# Patient Record
Sex: Female | Born: 1990 | Race: Black or African American | Hispanic: No | Marital: Single | Smoking: Former smoker
Health system: Southern US, Community
[De-identification: ages and names within clinical notes are randomized; demographics above are authoritative.]

## PROBLEM LIST (undated history)

## (undated) ENCOUNTER — Inpatient Hospital Stay (HOSPITAL_COMMUNITY): Payer: Self-pay

## (undated) DIAGNOSIS — R87619 Unspecified abnormal cytological findings in specimens from cervix uteri: Secondary | ICD-10-CM

## (undated) DIAGNOSIS — F209 Schizophrenia, unspecified: Secondary | ICD-10-CM

## (undated) DIAGNOSIS — O2620 Pregnancy care for patient with recurrent pregnancy loss, unspecified trimester: Secondary | ICD-10-CM

## (undated) HISTORY — PX: NO PAST SURGERIES: SHX2092

## (undated) HISTORY — DX: Pregnancy care for patient with recurrent pregnancy loss, unspecified trimester: O26.20

## (undated) HISTORY — DX: Unspecified abnormal cytological findings in specimens from cervix uteri: R87.619

---

## 2010-08-16 DIAGNOSIS — R87619 Unspecified abnormal cytological findings in specimens from cervix uteri: Secondary | ICD-10-CM

## 2010-08-16 HISTORY — DX: Unspecified abnormal cytological findings in specimens from cervix uteri: R87.619

## 2012-03-30 ENCOUNTER — Encounter (HOSPITAL_COMMUNITY): Payer: Self-pay

## 2012-03-30 ENCOUNTER — Emergency Department (HOSPITAL_COMMUNITY)
Admission: EM | Admit: 2012-03-30 | Discharge: 2012-03-30 | Disposition: A | Discharge status: Home / Self Care | Payer: Self-pay | Attending: Emergency Medicine | Admitting: Emergency Medicine

## 2012-03-30 DIAGNOSIS — R55 Syncope and collapse: Secondary | ICD-10-CM | POA: Insufficient documentation

## 2012-03-30 DIAGNOSIS — E860 Dehydration: Secondary | ICD-10-CM | POA: Insufficient documentation

## 2012-03-30 LAB — CBC
HCT: 40.2 % (ref 36.0–46.0)
MCHC: 33.1 g/dL (ref 30.0–36.0)
Platelets: 250 10*3/uL (ref 150–400)
RDW: 13.2 % (ref 11.5–15.5)
WBC: 5.4 10*3/uL (ref 4.0–10.5)

## 2012-03-30 LAB — BASIC METABOLIC PANEL
BUN: 6 mg/dL (ref 6–23)
Chloride: 102 mEq/L (ref 96–112)
Creatinine, Ser: 0.71 mg/dL (ref 0.50–1.10)
GFR calc Af Amer: >90 mL/min (ref >90)
GFR calc non Af Amer: >90 mL/min (ref >90)
Potassium: 3.6 mEq/L (ref 3.5–5.1)

## 2012-03-30 LAB — URINALYSIS, ROUTINE W REFLEX MICROSCOPIC
Bilirubin Urine: NEGATIVE
Ketones, ur: NEGATIVE mg/dL
Leukocytes, UA: NEGATIVE
Nitrite: NEGATIVE
Urobilinogen, UA: 0.2 mg/dL (ref 0.0–1.0)

## 2012-03-30 MED ORDER — METOCLOPRAMIDE HCL 5 MG/ML IJ SOLN
10.0000 mg | Freq: Once | INTRAMUSCULAR | Status: AC
  Administered 2012-03-30: 10 mg via INTRAVENOUS
  Filled 2012-03-30: qty 2

## 2012-03-30 MED ORDER — MORPHINE SULFATE 4 MG/ML IJ SOLN: 4.0000 mg | INTRAMUSCULAR | Status: DC | PRN

## 2012-03-30 MED ORDER — SODIUM CHLORIDE 0.9 % IV BOLUS (SEPSIS)
1000.0000 mL | Freq: Once | INTRAVENOUS | Status: AC
  Administered 2012-03-30: 1000 mL via INTRAVENOUS

## 2012-03-30 NOTE — ED Provider Notes (Addendum)
I have supervised the resident on the management of this patient and agree with the note above. I personally interviewed and examined the patient and my addendum is below.   [name] is a 21 y.o. female with no medical problems here with dizziness and near syncope. She has been working at 2 jobs and hasn't been drinking enough water. After work yesterday, she went home and made dinner. While making dinner, she felt lightheaded and almost passed out several times. She had palpitations at the time but no chest pain or SOB. She also has chronic headaches that is worse today.   Vitals reveal +orthostatics. EKG is NSR. CBC, BMP are nl. UCG is negative.  Her syncope is likely secondary to dehydration. NS 2L ordered, reglan ordered. She felt better after IVF and reglan and is safe for discharge. Counseled patient regarding hydration and establishing f/u with a doctor.   4:48 PM Patient received some IVF and reglan. She then became anxious and wanted to leave. The resident discussed with her regarding the risk and benefits of IVF. She wanted the IV out and she left AMA. She doesn't have any tremors on exam at discharge.    Richardean Canal, MD 03/30/12 1610  Richardean Canal, MD 03/30/12 [phone]

## 2012-03-30 NOTE — ED Notes (Signed)
Pt presents with report of syncopal episodes yesterday.  Pt reports she works 2 jobs, was in [employer] with near-syncopal episode.  Pt reports abdominal discomfort before episode.  Pt reports 2nd episode where she was walking into living room and "woke up on the couch".  Pt reports getting migraine yesterday morning, denies any today.  Pt reports dizziness when she awoke this morning but denies at present.  Pt reports her head is "stuffy" in triage, reports RLQ abdominal pain x 2 months.  Pt reports pain worsens in the morning.  Pt denies any dysuria or vaginal discharge.

## 2012-03-30 NOTE — ED Provider Notes (Signed)
History     CSN: [id number]  Arrival date & time 03/30/12  1318   First MD Initiated Contact with Patient 03/30/12 1507      Chief Complaint  Patient presents with  . Loss of Consciousness    (Consider location/radiation/quality/duration/timing/severity/associated sxs/prior treatment) HPI This is a 21 year old woman, with no significant past medical history, who comes in with a history of feeling dizzy and passing out last evening. She reports that she felt woozy while making dinner last evening and she was supported by her boyfriend to lie down on the bed. Since then, she has felt weak. There is no history of loss of consciousness or seizures. She denies any focal weakness in the extremities difficulty in speech. She denies any chest pain or shortness of breath. She denies palpitations. The patient is not taking any meds in the home. 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 She denies history of fever or chills. She has no past medical history of cardiac disease. She reports that she has experienced similar episodes in the past usually, when she's at work. She admits to inadequate fluid intake and often skipping meals.  She smokes about half a pack a day for the last 9 years. She has tried to quit several times without success.  There is no family history of premature cardiac disease or death.    History reviewed. No pertinent past medical history.  History reviewed. No pertinent past surgical history.  No family history on file.  History  Substance Use Topics  . Smoking status: Current Everyday Smoker -- 1.0 packs/day  . Smokeless tobacco: Not on file  . Alcohol Use: No    OB History    Grav Para Term Preterm Abortions TAB SAB Ect Mult Living                  Review of Systems  Constitutional: Negative.   HENT: Negative.   Eyes: Negative.   Respiratory: Negative for apnea, cough, chest  tightness, shortness of breath and stridor.   Cardiovascular: Negative for palpitations and leg swelling.  Genitourinary: Negative.   Musculoskeletal: Negative.   Neurological: Negative for tremors, light-headedness and numbness.  Psychiatric/Behavioral: Negative.     Allergies  Review of patient's allergies indicates no known allergies.  Home Medications  No current outpatient prescriptions on file.  BP 121/67  Pulse 75  Temp 98.3 F (36.8 C) (Oral)  Resp 16  Ht 5\' 10"  (1.778 m)  Wt 140 lb (63.504 kg)  BMI 20.09 kg/m2  SpO2 100%  LMP 03/06/2012  Physical Exam  Constitutional: She is oriented to person, place, and time. She appears well-developed and well-nourished.  HENT:  Head: Normocephalic and atraumatic.  Eyes: Conjunctivae and EOM are normal. Pupils are equal, round, and reactive to light.  Neck: Normal range of motion. Neck supple.  Cardiovascular: Normal rate, regular rhythm and normal heart sounds.   Pulmonary/Chest: Effort normal and breath sounds normal. No respiratory distress. She has no wheezes. She has no rales. She exhibits no tenderness.  Abdominal: Soft. Bowel sounds are normal. She exhibits no distension and no mass. There is no tenderness. There is no rebound and no guarding.  Musculoskeletal: Normal range of motion. She exhibits no edema and no tenderness.  Neurological: She is alert and oriented to person, place, and time. She has normal strength and normal reflexes. No cranial nerve deficit or sensory deficit.  She displays a negative Romberg sign. Coordination normal. GCS eye subscore is 4. GCS verbal subscore is 5. GCS motor subscore is 6.  Skin: Skin is warm and dry.    ED Course  Procedures (including critical care time)   Labs Reviewed  CBC  BASIC METABOLIC PANEL  URINALYSIS, ROUTINE W REFLEX MICROSCOPIC  POCT PREGNANCY, URINE   No results found.    Date: 03/30/2012  Rate: 84 regular.  Rhythm: normal sinus rhythm  QRS Axis: normal   Intervals: normal  ST/T Wave abnormalities: normal  Conduction Disutrbances:none  Narrative Interpretation:   Old EKG Reviewed: No old EKGs to compare with.     MDM  This is a 21 year old, young woman, presenting with dizziness, weakness, and headaches with significant stressors in her life, including 2 jobs. She admits to inadequate fluid intake and sometimes skipping meals. She is not taking any meds since to account for his symptoms. There is no evidence of a focal neurological deficit or a cardiac disease and her EKG is normal. The patient's symptoms seem to suggest dehydration, and probably stress. Evaluation in the ED, has not revealed any focal neurological deficits. Orthostatic evaluation is negative for BP but her pulse increased from 62 to 112 (about 60 increase). CBC, basic metabolic panel, and urinalysis, have all shown negative results. She she's been rehydrated with 2 L of normal saline. She will be discharged on Tylenol and encouragement to increase her fluid in take.        Dow Adolph, MD 03/30/12 1604  Dow Adolph, MD 03/30/12 [phone]

## 2012-04-01 ENCOUNTER — Emergency Department (HOSPITAL_COMMUNITY)
Admission: EM | Admit: 2012-04-01 | Discharge: 2012-04-01 | Disposition: A | Discharge status: Home / Self Care | Payer: Self-pay | Attending: Emergency Medicine | Admitting: Emergency Medicine

## 2012-04-01 ENCOUNTER — Encounter (HOSPITAL_COMMUNITY): Payer: Self-pay | Admitting: *Deleted

## 2012-04-01 ENCOUNTER — Emergency Department (HOSPITAL_COMMUNITY): Payer: Self-pay

## 2012-04-01 DIAGNOSIS — R110 Nausea: Secondary | ICD-10-CM | POA: Insufficient documentation

## 2012-04-01 DIAGNOSIS — F419 Anxiety disorder, unspecified: Secondary | ICD-10-CM

## 2012-04-01 DIAGNOSIS — F411 Generalized anxiety disorder: Secondary | ICD-10-CM | POA: Insufficient documentation

## 2012-04-01 DIAGNOSIS — R0602 Shortness of breath: Secondary | ICD-10-CM | POA: Insufficient documentation

## 2012-04-01 DIAGNOSIS — F17200 Nicotine dependence, unspecified, uncomplicated: Secondary | ICD-10-CM | POA: Insufficient documentation

## 2012-04-01 LAB — CBC
MCH: 27.4 pg (ref 26.0–34.0)
MCHC: 32.9 g/dL (ref 30.0–36.0)
MCV: 83.3 fL (ref 78.0–100.0)
Platelets: 241 10*3/uL (ref 150–400)
RDW: 13.3 % (ref 11.5–15.5)

## 2012-04-01 LAB — BASIC METABOLIC PANEL
Calcium: 10.2 mg/dL (ref 8.4–10.5)
Creatinine, Ser: 0.72 mg/dL (ref 0.50–1.10)
GFR calc Af Amer: >90 mL/min (ref >90)
GFR calc non Af Amer: >90 mL/min (ref >90)
Sodium: 137 mEq/L (ref 135–145)

## 2012-04-01 LAB — POCT I-STAT TROPONIN I: Troponin i, poc: 0 ng/mL (ref 0.00–0.08)

## 2012-04-01 NOTE — ED Notes (Signed)
Pt scared of needles and refuses to get blood drawn.

## 2012-04-01 NOTE — ED Notes (Signed)
Pt was seen here on Thursday for dehydration and syncope. Reports waking up this am with left side chest pains, radiates around to left rib area. Also having nausea this am.

## 2012-04-01 NOTE — ED Notes (Signed)
Patient is resting comfortably. 

## 2012-04-01 NOTE — ED Provider Notes (Signed)
History     CSN: [id number]  Arrival date & time 04/01/12  1410   First MD Initiated Contact with Patient 04/01/12 1551      Chief Complaint  Patient presents with  . Chest Pain  . Nausea    (Consider location/radiation/quality/duration/timing/severity/associated sxs/prior treatment) Patient is a 21 y.o. female presenting with chest pain. The history is provided by the patient.  Chest Pain The chest pain began 6 - 12 hours ago. Chest pain occurs frequently. The chest pain is unchanged. The quality of the pain is described as sharp. The pain does not radiate. Primary symptoms include nausea. Pertinent negatives for primary symptoms include no fever, no syncope, no shortness of breath and no palpitations. Associated symptoms comments: Sharp, left sided chest pain that woke her up during the night, lasted for several minutes before resolving. She has had recurrent episodes since that time that also last several minutes then resolve. No SOB, cough, fever. She reports nausea without vomiting but that this has been a problem for 'some time'. .     History reviewed. No pertinent past medical history.  History reviewed. No pertinent past surgical history.  History reviewed. No pertinent family history.  History  Substance Use Topics  . Smoking status: Current Everyday Smoker -- 1.0 packs/day  . Smokeless tobacco: Not on file  . Alcohol Use: No    OB History    Grav Para Term Preterm Abortions TAB SAB Ect Mult Living                  Review of Systems  Constitutional: Negative for fever and chills.  HENT: Negative.   Respiratory: Negative.  Negative for shortness of breath.   Cardiovascular: Positive for chest pain. Negative for palpitations and syncope.  Gastrointestinal: Positive for nausea.  Musculoskeletal: Negative.  Negative for back pain.  Skin: Negative.   Neurological: Negative.     Allergies  Review of patient's allergies indicates no known allergies.  Home  Medications   Current Outpatient Rx  Name Route Sig Dispense Refill  . ACETAMINOPHEN 500 MG PO TABS Oral Take 2,000 mg by mouth every 6 (six) hours as needed. For pain    . VCF VAGINAL CONTRACEPTIVE VA Vaginal Place 1 application vaginally as needed. For sexual activity    . MEDERMA EX GEL Topical Apply 1 application topically 3 (three) times daily. For scar treatment      BP 108/62  Pulse 83  Temp 97.7 F (36.5 C) (Oral)  Resp 20  SpO2 97%  LMP 03/06/2012  Physical Exam  Constitutional: She is oriented to person, place, and time. She appears well-developed and well-nourished. No distress.  HENT:  Head: Normocephalic.  Neck: Normal range of motion. Neck supple.  Cardiovascular: Normal rate and regular rhythm.   Pulmonary/Chest: Effort normal and breath sounds normal. She has no wheezes. She has no rales. She exhibits no tenderness.  Abdominal: Soft. Bowel sounds are normal. There is no tenderness. There is no rebound and no guarding.  Musculoskeletal: Normal range of motion. She exhibits no edema.  Neurological: She is alert and oriented to person, place, and time.  Skin: Skin is warm and dry.  Psychiatric: She has a normal mood and affect.    ED Course  Procedures (including critical care time)   Labs Reviewed  CBC  BASIC METABOLIC PANEL  POCT I-STAT TROPONIN I  .Dg Chest 2 View  04/01/2012  *RADIOLOGY REPORT*  Clinical Data: Chest pain and shortness of breath.  CHEST -  2 VIEW  Comparison: None.  Findings: Lungs are clear.  No pneumothorax or pleural fluid. Heart size normal.  IMPRESSION: Negative chest.  Original Report Authenticated By: Bernadene Bell. Maricela Curet, M.D.    Results for orders placed during the hospital encounter of 04/01/12  CBC      Component Value Range   WBC 7.2  4.0 - 10.5 K/uL   RBC 4.97  3.87 - 5.11 MIL/uL   Hemoglobin 13.6  12.0 - 15.0 g/dL   HCT 16.1  09.6 - 04.5 %   MCV 83.3  78.0 - 100.0 fL   MCH 27.4  26.0 - 34.0 pg   MCHC 32.9  30.0 - 36.0  g/dL   RDW 40.9  81.1 - 91.4 %   Platelets 241  150 - 400 K/uL  BASIC METABOLIC PANEL      Component Value Range   Sodium 137  135 - 145 mEq/L   Potassium 3.6  3.5 - 5.1 mEq/L   Chloride 101  96 - 112 mEq/L   CO2 25  19 - 32 mEq/L   Glucose, Bld 83  70 - 99 mg/dL   BUN 11  6 - 23 mg/dL   Creatinine, Ser 7.82  0.50 - 1.10 mg/dL   Calcium 95.6  8.4 - 21.3 mg/dL   GFR calc non Af Amer >90  >90 mL/min   GFR calc Af Amer >90  >90 mL/min  POCT I-STAT TROPONIN I      Component Value Range   Troponin i, poc 0.00  0.00 - 0.08 ng/mL   Comment 3             Date: 04/01/2012  Rate: 101  Rhythm: sinus tachycardia  QRS Axis: normal  Intervals: normal  ST/T Wave abnormalities: normal  Conduction Disutrbances:none  Narrative Interpretation:   Old EKG Reviewed: none available   No diagnosis found. 1. Anxiety   MDM  Normal exam with normal EKG. She is tachycardic on EKG to 101, however, in the 80's on the room monitor. She denies shortness of breath. Contraceptive use is limited to "one time use' vaginal inserts. She denies pleuritic pain. Doubt PE. She reports a history of anxiety. She reports no symptoms at present.  Labs/x-ray negative. Negative pregnancy test in ED on 8/15.         Rodena Medin, PA-C 04/01/12 1710

## 2012-04-01 NOTE — ED Notes (Signed)
Family at bedside. 

## 2012-04-01 NOTE — ED Notes (Addendum)
Patient is resting comfortably. 

## 2012-04-02 NOTE — ED Provider Notes (Signed)
Medical screening examination/treatment/procedure(s) were performed by non-physician practitioner and as supervising physician I was immediately available for consultation/collaboration.    Nelia Shi, MD 04/02/12 [phone]

## 2012-10-10 ENCOUNTER — Encounter (HOSPITAL_COMMUNITY): Payer: Self-pay | Admitting: *Deleted

## 2012-10-10 ENCOUNTER — Emergency Department (HOSPITAL_COMMUNITY)
Admission: EM | Admit: 2012-10-10 | Discharge: 2012-10-10 | Disposition: A | Discharge status: Home / Self Care | Payer: Self-pay | Attending: Emergency Medicine | Admitting: Emergency Medicine

## 2012-10-10 DIAGNOSIS — Y939 Activity, unspecified: Secondary | ICD-10-CM | POA: Insufficient documentation

## 2012-10-10 DIAGNOSIS — Y929 Unspecified place or not applicable: Secondary | ICD-10-CM | POA: Insufficient documentation

## 2012-10-10 DIAGNOSIS — Z23 Encounter for immunization: Secondary | ICD-10-CM | POA: Insufficient documentation

## 2012-10-10 DIAGNOSIS — W268XXA Contact with other sharp object(s), not elsewhere classified, initial encounter: Secondary | ICD-10-CM | POA: Insufficient documentation

## 2012-10-10 DIAGNOSIS — F17200 Nicotine dependence, unspecified, uncomplicated: Secondary | ICD-10-CM | POA: Insufficient documentation

## 2012-10-10 DIAGNOSIS — S61409A Unspecified open wound of unspecified hand, initial encounter: Secondary | ICD-10-CM | POA: Insufficient documentation

## 2012-10-10 MED ORDER — TETANUS-DIPHTH-ACELL PERTUSSIS 5-2.5-18.5 LF-MCG/0.5 IM SUSP
0.5000 mL | Freq: Once | INTRAMUSCULAR | Status: AC
  Administered 2012-10-10: 0.5 mL via INTRAMUSCULAR
  Filled 2012-10-10 (×2): qty 0.5

## 2012-10-10 NOTE — ED Provider Notes (Signed)
History     CSN: [id number]  Arrival date & time 10/10/12  1118   First MD Initiated Contact with Patient 10/10/12 1124      Chief Complaint  Patient presents with  . Laceration    L hand    (Consider location/radiation/quality/duration/timing/severity/associated sxs/prior treatment) HPI Comments: Patient is a 22 year old female who presents with a laceration that occurred last night. She reports cutting her left hand on a broken shot glass. She reports associated bleeding after the incident. She denies pain or any other associated symptoms. She did not try anything for symptom relief.    History reviewed. No pertinent past medical history.  History reviewed. No pertinent past surgical history.  No family history on file.  History  Substance Use Topics  . Smoking status: Current Every Day Smoker -- 1.00 packs/day  . Smokeless tobacco: Not on file  . Alcohol Use: No    OB History   Grav Para Term Preterm Abortions TAB SAB Ect Mult Living                  Review of Systems  Skin: Positive for wound.  All other systems reviewed and are negative.    Allergies  Review of patient's allergies indicates no known allergies.  Home Medications  No current outpatient prescriptions on file.  BP 121/75  Pulse 98  Temp(Src) 98 F (36.7 C) (Oral)  Resp 18  SpO2 100%  LMP 09/26/2012  Physical Exam  Nursing note and vitals reviewed. Constitutional: She is oriented to person, place, and time. She appears well-developed and well-nourished. No distress.  HENT:  Head: Normocephalic and atraumatic.  Eyes: Conjunctivae are normal.  Neck: Normal range of motion.  Cardiovascular: Normal rate and regular rhythm.  Exam reveals no gallop and no friction rub.   No murmur heard. Pulmonary/Chest: Effort normal and breath sounds normal. She has no wheezes. She has no rales. She exhibits no tenderness.  Abdominal: Soft. There is no tenderness.  Musculoskeletal: Normal range of  motion.  Neurological: She is alert and oriented to person, place, and time.  Left thumb sensation intact. Speech is goal-oriented. Moves limbs without ataxia.   Skin: Skin is warm and dry.  1.5 cm laceration to left thenar area. No active bleeding. No surrounding erythema or edema. No purulent discharge from wound.   Psychiatric: She has a normal mood and affect. Her behavior is normal.    ED Course  Procedures (including critical care time)  LACERATION REPAIR Performed by: Emilia Beck Authorized by: Emilia Beck Consent: Verbal consent obtained. Risks and benefits: risks, benefits and alternatives were discussed Consent given by: patient Patient identity confirmed: provided demographic data Prepped and Draped in normal sterile fashion Wound explored  Laceration Location: left thenar area  Laceration Length: 1.5 cm  No Foreign Bodies seen or palpated  Anesthesia: none  Anesthetic total: 0 ml  Irrigation method: syringe Amount of cleaning: standard  Skin closure: steri strips  Number of sutures: n/a  Technique: n/a  Patient tolerance: Patient tolerated the procedure well with no immediate complications.   Labs Reviewed - No data to display No results found.   1. Laceration       MDM  12:22 PM Laceration repaired with steri strips. Tetanus shot given. Patient will be discharged without further evaluation.         Emilia Beck, [place] 10/16/12 2111

## 2012-10-10 NOTE — ED Notes (Signed)
Please see triage note

## 2012-10-10 NOTE — ED Notes (Signed)
While giving pt her discharge instructions, pt tells this nurse that her L thumb is numb and that she can barely  Move it.  Will notify [facility]PA

## 2012-10-10 NOTE — ED Notes (Signed)
Pt reports a shot glass broke cutting her L hand last night.  No active bleeding noted at this time.

## 2012-10-18 NOTE — ED Provider Notes (Signed)
Medical screening examination/treatment/procedure(s) were performed by non-physician practitioner and as supervising physician I was immediately available for consultation/collaboration.   Ward Givens, MD 10/18/12 [phone]

## 2013-02-14 ENCOUNTER — Encounter (HOSPITAL_COMMUNITY): Payer: Self-pay | Admitting: *Deleted

## 2013-02-14 ENCOUNTER — Emergency Department (HOSPITAL_COMMUNITY)
Admission: EM | Admit: 2013-02-14 | Discharge: 2013-02-15 | Disposition: A | Discharge status: Home / Self Care | Payer: Self-pay | Attending: Emergency Medicine | Admitting: Emergency Medicine

## 2013-02-14 DIAGNOSIS — K5900 Constipation, unspecified: Secondary | ICD-10-CM | POA: Insufficient documentation

## 2013-02-14 DIAGNOSIS — K648 Other hemorrhoids: Secondary | ICD-10-CM | POA: Insufficient documentation

## 2013-02-14 DIAGNOSIS — R109 Unspecified abdominal pain: Secondary | ICD-10-CM | POA: Insufficient documentation

## 2013-02-14 DIAGNOSIS — R110 Nausea: Secondary | ICD-10-CM | POA: Insufficient documentation

## 2013-02-14 DIAGNOSIS — K6289 Other specified diseases of anus and rectum: Secondary | ICD-10-CM | POA: Insufficient documentation

## 2013-02-14 DIAGNOSIS — K643 Fourth degree hemorrhoids: Secondary | ICD-10-CM

## 2013-02-14 DIAGNOSIS — F17200 Nicotine dependence, unspecified, uncomplicated: Secondary | ICD-10-CM | POA: Insufficient documentation

## 2013-02-14 DIAGNOSIS — K641 Second degree hemorrhoids: Secondary | ICD-10-CM

## 2013-02-14 MED ORDER — HYDROCODONE-ACETAMINOPHEN 5-325 MG PO TABS: 1.0000 | ORAL_TABLET | Freq: Four times a day (QID) | ORAL | Status: DC | PRN

## 2013-02-14 MED ORDER — DOCUSATE SODIUM 100 MG PO CAPS
100.0000 mg | ORAL_CAPSULE | Freq: Two times a day (BID) | ORAL | Status: DC
Start: 2013-02-14 — End: 2014-01-29

## 2013-02-14 MED ORDER — LORAZEPAM 1 MG PO TABS
1.0000 mg | ORAL_TABLET | Freq: Once | ORAL | Status: AC
  Administered 2013-02-14: 1 mg via ORAL
  Filled 2013-02-14: qty 1

## 2013-02-14 MED ORDER — ONDANSETRON 4 MG PO TBDP
8.0000 mg | ORAL_TABLET | Freq: Once | ORAL | Status: AC
  Administered 2013-02-14: 8 mg via ORAL
  Filled 2013-02-14: qty 2

## 2013-02-14 MED ORDER — LIDOCAINE HCL 2 % EX GEL
Freq: Once | CUTANEOUS | Status: AC
  Administered 2013-02-14: 20 via TOPICAL
  Filled 2013-02-14: qty 20

## 2013-02-14 MED ORDER — HYDROMORPHONE HCL PF 1 MG/ML IJ SOLN
1.0000 mg | Freq: Once | INTRAMUSCULAR | Status: AC
  Administered 2013-02-14: 1 mg via INTRAMUSCULAR
  Filled 2013-02-14: qty 1

## 2013-02-14 MED ORDER — LIDOCAINE HCL 2 % EX GEL: CUTANEOUS | Status: DC | PRN

## 2013-02-14 NOTE — ED Provider Notes (Signed)
This chart was scribed for Antony Madura PA-C,  a non-physician practitioner working with No att. providers found by Lewanda Rife, ED Scribe. This patient was seen in room TR10C/TR10C and the patient's care was started at 2257.     History    CSN: [id number] Arrival date & time 02/14/13  2102  First MD Initiated Contact with Patient 02/14/13 2139     Chief Complaint  Patient presents with  . Hemorrhoids   (Consider location/radiation/quality/duration/timing/severity/associated sxs/prior Treatment) The history is provided by the patient.   HPI Comments: [name] is a 22 y.o. female who presents to the Emergency Department complaining of waxing and waning hemorrhoids onset 4 years. Reports worsening pain since yesterday. Reports associated nausea, constipation, and rectal pain radiating to lower abdomen, and intermittent mild bright red blood per rectum on toilet tissue secondary to constipated bowel movement. Denies associated fever, paraesthesias, hematuria, melena, hematochezia, syncope, shortness of breath, and weakness. Reports pain is aggravated with topical vinegar, bowel movements, and sitting. Reports trying OTC preparation H with no relief of symptoms. Denies having any hemorrhoids surgically removed, or thrombosis. Denies hx of bleeding disorder.   Reports last bowel movement was yesterday; normal in color and slightly constipated in consistency.   History reviewed. No pertinent past medical history. No past surgical history on file. No family history on file. History  Substance Use Topics  . Smoking status: Current Every Day Smoker -- 1.00 packs/day  . Smokeless tobacco: Not on file  . Alcohol Use: No   OB History   Grav Para Term Preterm Abortions TAB SAB Ect Mult Living                 Review of Systems  Constitutional: Negative for fever.  Respiratory: Negative for shortness of breath.   Gastrointestinal: Positive for nausea, abdominal pain, constipation  and rectal pain. Negative for vomiting and blood in stool.  Genitourinary: Negative for hematuria.  Neurological: Negative for syncope.  Hematological: Does not bruise/bleed easily.  Psychiatric/Behavioral: Negative for confusion.  All other systems reviewed and are negative.   A complete 10 system review of systems was obtained and all systems are negative except as noted in the HPI and PMH.    Allergies  Review of patient's allergies indicates no known allergies.  Home Medications   Current Outpatient Rx  Name  Route  Sig  Dispense  Refill  . acetaminophen (TYLENOL) 500 MG tablet   Oral   Take 1,500 mg by mouth daily as needed for pain. For pain         . docusate sodium (COLACE) 100 MG capsule   Oral   Take 1 capsule (100 mg total) by mouth every 12 (twelve) hours.   30 capsule   0   . HYDROcodone-acetaminophen (NORCO/VICODIN) 5-325 MG per tablet   Oral   Take 1 tablet by mouth every 6 (six) hours as needed for pain.   8 tablet   0   . lidocaine (XYLOCAINE JELLY) 2 % jelly   Topical   Apply topically as needed.   30 mL   0    BP 116/79  Pulse 72  Temp(Src) 98.2 F (36.8 C) (Oral)  Resp 16  SpO2 97%  LMP 01/31/2013  Physical Exam  Nursing note and vitals reviewed. Constitutional: She is oriented to person, place, and time. She appears well-developed and well-nourished. No distress.  HENT:  Head: Normocephalic and atraumatic.  Mouth/Throat: Uvula is midline, oropharynx is clear and moist and mucous  membranes are normal. No oropharyngeal exudate, posterior oropharyngeal edema, posterior oropharyngeal erythema or tonsillar abscesses.  Eyes: Conjunctivae and EOM are normal. No scleral icterus.  Neck: Neck supple. No tracheal deviation present.  Cardiovascular: Normal rate, regular rhythm and normal heart sounds.   Pulmonary/Chest: Effort normal and breath sounds normal. No respiratory distress. She has no wheezes. She has no rales.  Abdominal: Soft. She  exhibits no distension and no mass. There is no tenderness. There is no rebound and no guarding.  Genitourinary: Rectal exam shows internal hemorrhoid (x 2) and tenderness (TTP not out of proportion to exam). Rectal exam shows no external hemorrhoid, no fissure and anal tone normal.  Grade 2 internal hemorrhoid and Grade 4 internal hemorrhoid appreciated. No erythema or heat-to-touch or induration around rectum. No anal fissure. Rectal tone normal.  Musculoskeletal: Normal range of motion.  Lymphadenopathy:    She has no cervical adenopathy.  Neurological: She is alert and oriented to person, place, and time.  Skin: Skin is warm and dry.  Psychiatric: She has a normal mood and affect. Her behavior is normal.    ED Course  Procedures (including critical care time) Medications  lidocaine (XYLOCAINE) 2 % jelly (20 application Topical Given 02/14/13 2246)  LORazepam (ATIVAN) tablet 1 mg (1 mg Oral Given 02/14/13 2245)  HYDROmorphone (DILAUDID) injection 1 mg (1 mg Intramuscular Given 02/14/13 2247)  ondansetron (ZOFRAN-ODT) disintegrating tablet 8 mg (8 mg Oral Given 02/14/13 2245)   Labs Reviewed - No data to display No results found.  1. Prolapsed internal hemorrhoids, grade 2   2. Prolapsed internal hemorrhoids, grade 4     MDM  Uncomplicated, nonthrombosed internal hemorrhoids. Tenderness to palpation on chaperoned rectal exam not out of proportion to exam findings. No erythema, heat-to-touch, or induration around the rectum to suspect perirectal abscess. No signs of systemic infection such as fevers, tachycardia, or hypotension to suspect complicating process. Patient appropriate for discharge with general surgery followup for further evaluation of symptoms. Patient prescribed Colace and Xylocaine and told to continue using Preparation H for symptoms. Indications for ED return discussed with the patient who verbalizes comfort and understanding with this discharge plan.  I personally performed  the services described in this documentation, which was scribed in my presence. The recorded information has been reviewed and is accurate.     Antony Madura, PA-C 02/15/13 0107

## 2013-02-14 NOTE — ED Notes (Addendum)
hemorrhoid flair up. Swelling and extreme pain.

## 2013-02-14 NOTE — ED Notes (Signed)
Pt reports hemmorroids since 2010, reports flare-up since yesterday. Pt reports applying vinegar to rectum around 1830 this evening, increasing the pain. Pt denies any frank blood. Pt reports nausea since yesterday, denies V/D. Denies SOB, fever/chills.

## 2013-02-14 NOTE — ED Notes (Signed)
UNABLE TO LOCATE PT. AT TRIAGE/WAITING AREA.

## 2013-02-14 NOTE — ED Notes (Signed)
PT comfortable with d/c and f/u instructions. Prescriptions x3 

## 2013-02-14 NOTE — ED Notes (Signed)
Lidocaine requested from pharmacy

## 2013-02-16 NOTE — ED Provider Notes (Signed)
Medical screening examination/treatment/procedure(s) were performed by non-physician practitioner and as supervising physician I was immediately available for consultation/collaboration.   Richardean Canal, MD 02/16/13 [phone]

## 2013-03-20 IMAGING — CR DG CHEST 2V
2 series · 2 of 2 positions shown · non-contrast
Comparison: None.

CLINICAL DATA: Chest pain and shortness of breath.

CHEST - 2 VIEW

[w chest pa]
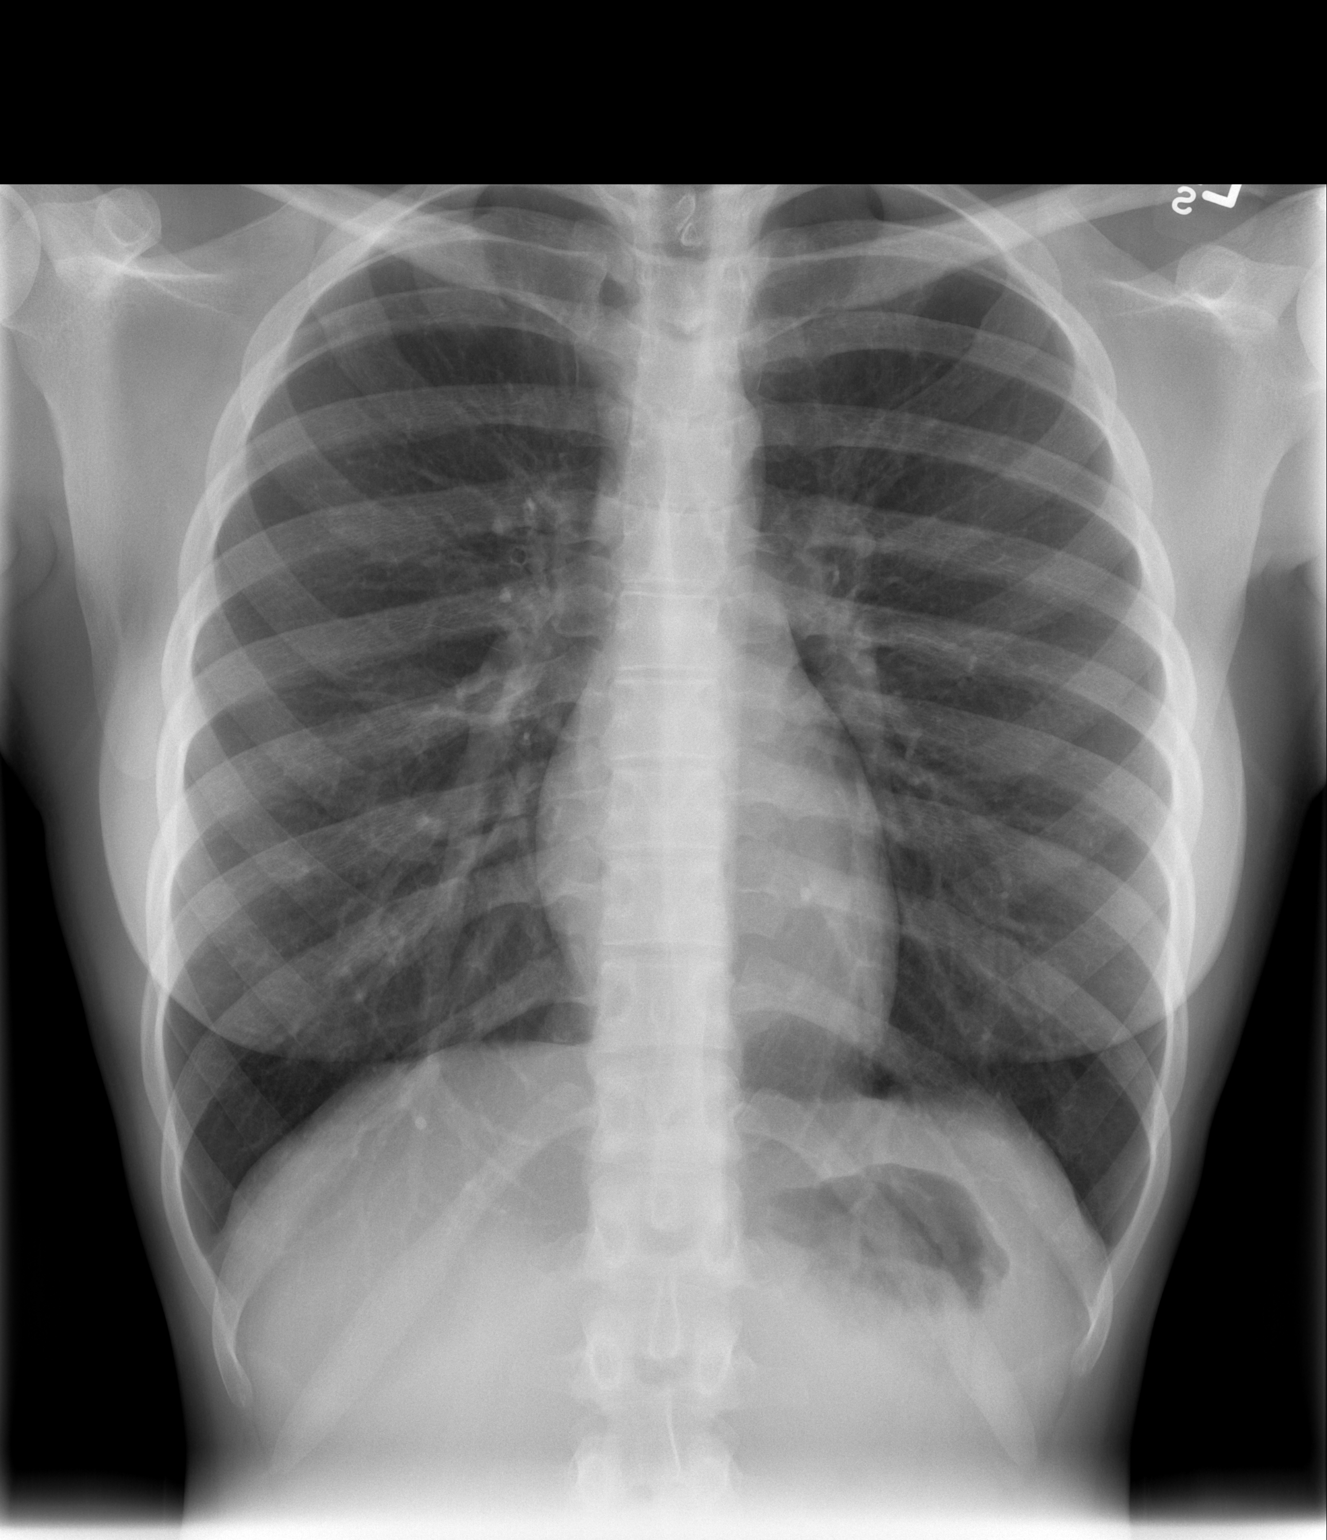

[w chest lat]
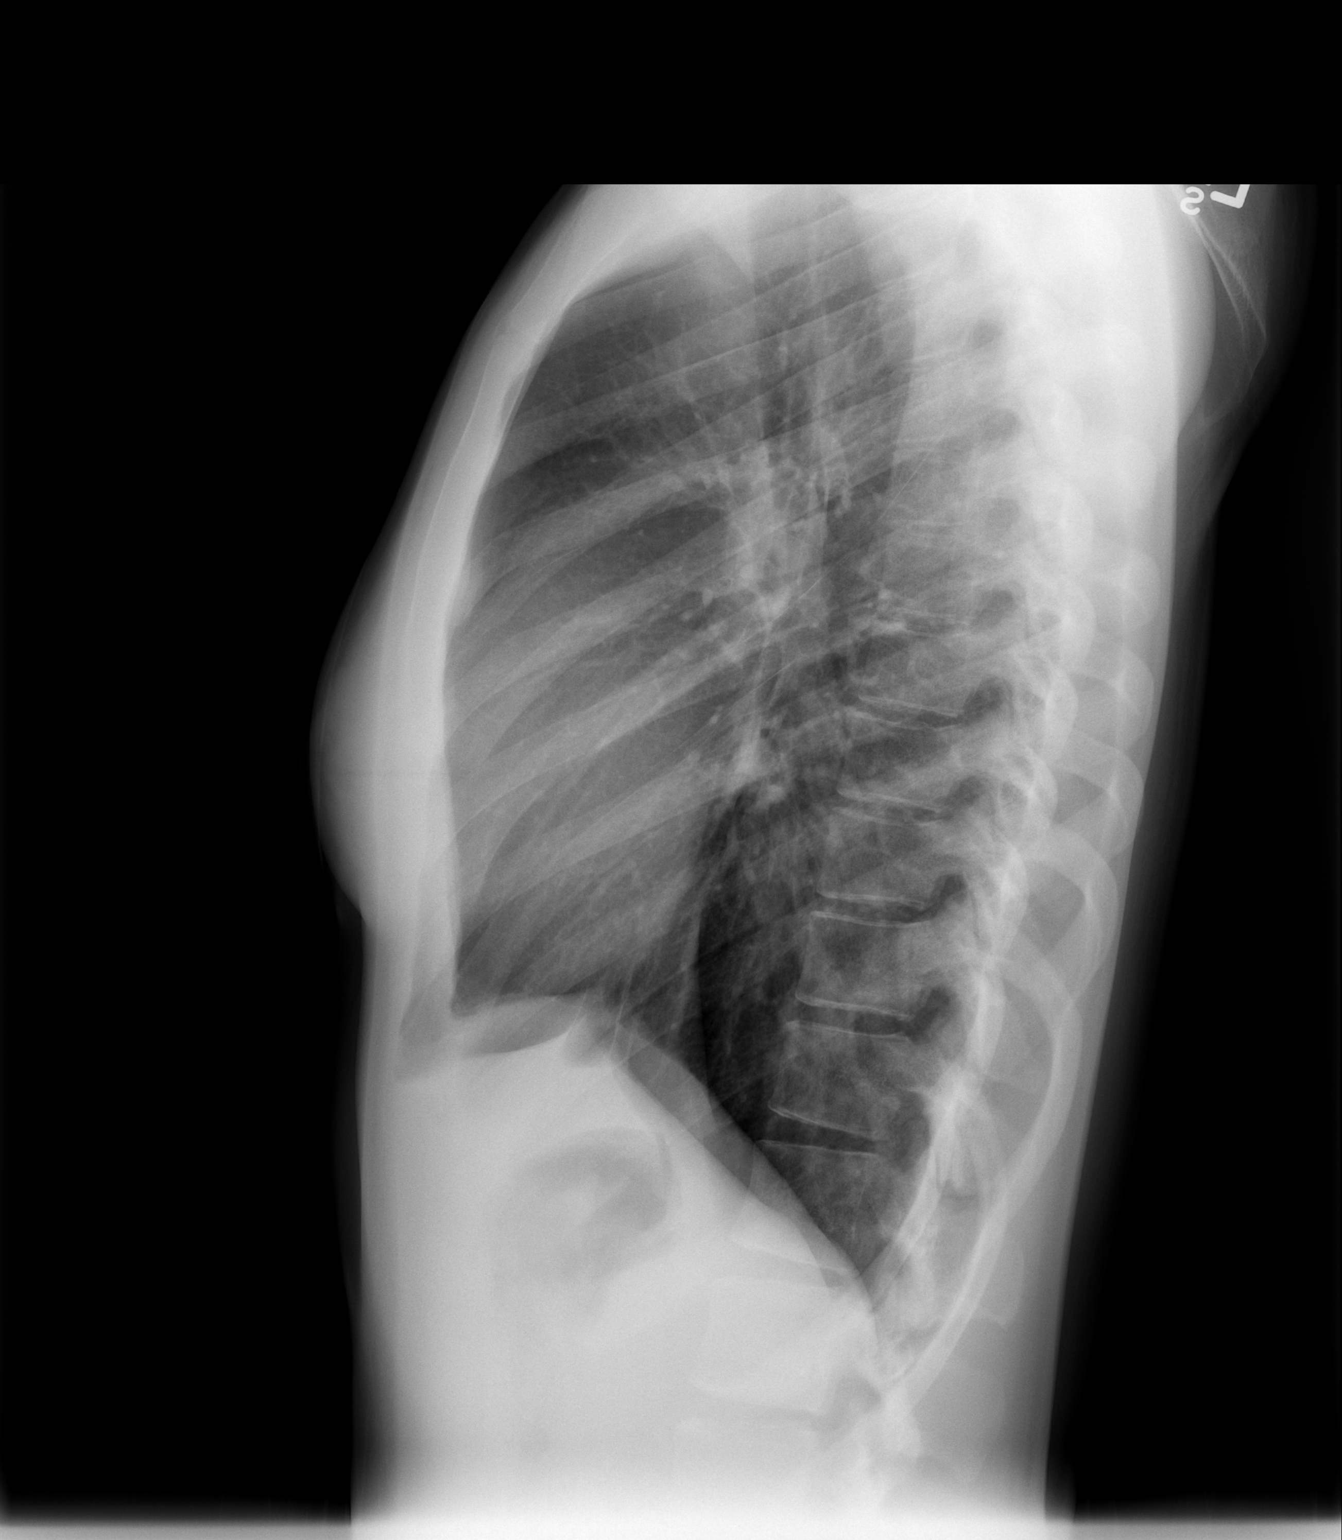

[2 of 2 positions shown; findings below may reference images not displayed]

FINDINGS: Lungs are clear.  No pneumothorax or pleural fluid.
Heart size normal.
IMPRESSION: Negative chest.

## 2014-01-29 ENCOUNTER — Other Ambulatory Visit (HOSPITAL_COMMUNITY)
Admission: RE | Admit: 2014-01-29 | Discharge: 2014-01-29 | Disposition: A | Discharge status: Home / Self Care | Payer: Medicaid Other | Source: Ambulatory Visit | Attending: Obstetrics & Gynecology | Admitting: Obstetrics & Gynecology

## 2014-01-29 ENCOUNTER — Encounter: Payer: Self-pay | Admitting: Obstetrics & Gynecology

## 2014-01-29 ENCOUNTER — Encounter: Payer: Self-pay | Admitting: *Deleted

## 2014-01-29 ENCOUNTER — Ambulatory Visit (INDEPENDENT_AMBULATORY_CARE_PROVIDER_SITE_OTHER): Payer: Medicaid Other | Admitting: Obstetrics & Gynecology

## 2014-01-29 VITALS — BP 105/65 | HR 103 | Wt 144.0 lb

## 2014-01-29 DIAGNOSIS — F1290 Cannabis use, unspecified, uncomplicated: Secondary | ICD-10-CM

## 2014-01-29 DIAGNOSIS — F1210 Cannabis abuse, uncomplicated: Secondary | ICD-10-CM

## 2014-01-29 DIAGNOSIS — Z01419 Encounter for gynecological examination (general) (routine) without abnormal findings: Secondary | ICD-10-CM | POA: Insufficient documentation

## 2014-01-29 DIAGNOSIS — Z3400 Encounter for supervision of normal first pregnancy, unspecified trimester: Secondary | ICD-10-CM

## 2014-01-29 DIAGNOSIS — Z113 Encounter for screening for infections with a predominantly sexual mode of transmission: Secondary | ICD-10-CM | POA: Insufficient documentation

## 2014-01-29 NOTE — Addendum Note (Signed)
Addended by: Granville LewisLARK, Annais Crafts L on: 01/29/2014 04:09 PM   Modules accepted: Orders

## 2014-01-29 NOTE — Progress Notes (Signed)
Bedside U/S showed IUP and CRL of 17.391mm and GA of 6582w1d and FHT 173

## 2014-01-29 NOTE — Progress Notes (Signed)
   Subjective:    [name] is a S AA G6P0050 (sp ab) 7558w3d being seen today for her first obstetrical visit.  Her obstetrical history is significant for none. Patient does intend to breast feed. Pregnancy history fully reviewed.  Patient reports no complaints.  Filed Vitals:   01/29/14 1443  BP: 105/65  Pulse: 103  Weight: 144 lb (65.318 kg)    HISTORY: OB History  Gravida Para Term Preterm AB SAB TAB Ectopic Multiple Living  6    5 5         # Outcome Date GA Lbr Len/2nd Weight Sex Delivery Anes PTL Lv  6 CUR           5 SAB           4 SAB           3 SAB           2 SAB           1 SAB              Past Medical History  Diagnosis Date  . Habitual aborter, currently pregnant   . Abnormal Pap smear of cervix 2012   History reviewed. No pertinent past surgical history. Family History  Problem Relation Age of Onset  . Hypertension Mother   . Hypertension Maternal Grandmother   . Hypertension Maternal Grandfather      Exam    Uterus:     Pelvic Exam:    Perineum: No Hemorrhoids   Vulva: normal   Vagina:  normal mucosa   pH:    Cervix: anteverted   Adnexa: normal adnexa   Bony Pelvis: android  System: Breast:  normal appearance, no masses or tenderness   Skin: normal coloration and turgor, no rashes    Neurologic: oriented   Extremities: normal strength, tone, and muscle mass   HEENT PERRLA   Mouth/Teeth mucous membranes moist, pharynx normal without lesions   Neck supple   Cardiovascular: regular rate and rhythm   Respiratory:  appears well, vitals normal, no respiratory distress, acyanotic, normal RR, ear and throat exam is normal, neck free of mass or lymphadenopathy, chest clear, no wheezing, crepitations, rhonchi, normal symmetric air entry   Abdomen: soft, non-tender; bowel sounds normal; no masses,  no organomegaly   Urinary: urethral meatus normal      Assessment:    Pregnancy: G6P0050 There are no active problems to display for this  patient.       Plan:     Initial labs drawn. Prenatal vitamins. Problem list reviewed and updated. Genetic Screening discussed First Screen: ordered.  Ultrasound discussed; fetal survey: requested.  Follow up in 4 weeks.    DOVE,MYRA C. 01/29/2014

## 2014-01-30 LAB — OBSTETRIC PANEL
ANTIBODY SCREEN: NEGATIVE
Basophils Absolute: 0 10*3/uL (ref 0.0–0.1)
Basophils Relative: 0 % (ref 0–1)
EOS ABS: 0.1 10*3/uL (ref 0.0–0.7)
EOS PCT: 2 % (ref 0–5)
HCT: 33.2 % — ABNORMAL LOW (ref 36.0–46.0)
HEMOGLOBIN: 11.4 g/dL — AB (ref 12.0–15.0)
HEP B S AG: NEGATIVE
LYMPHS ABS: 2.1 10*3/uL (ref 0.7–4.0)
Lymphocytes Relative: 29 % (ref 12–46)
MCH: 27.9 pg (ref 26.0–34.0)
MCHC: 34.3 g/dL (ref 30.0–36.0)
MCV: 81.4 fL (ref 78.0–100.0)
MONO ABS: 0.6 10*3/uL (ref 0.1–1.0)
MONOS PCT: 8 % (ref 3–12)
NEUTROS PCT: 61 % (ref 43–77)
Neutro Abs: 4.4 10*3/uL (ref 1.7–7.7)
Platelets: 240 10*3/uL (ref 150–400)
RBC: 4.08 MIL/uL (ref 3.87–5.11)
RDW: 14.3 % (ref 11.5–15.5)
Rh Type: POSITIVE
Rubella: 3.78 Index — ABNORMAL HIGH (ref <0.90)
WBC: 7.2 10*3/uL (ref 4.0–10.5)

## 2014-01-30 LAB — SICKLE CELL SCREEN: Sickle Cell Screen: NEGATIVE

## 2014-01-30 LAB — HIV ANTIBODY (ROUTINE TESTING W REFLEX): HIV 1&2 Ab, 4th Generation: NONREACTIVE

## 2014-01-31 LAB — CULTURE, URINE COMPREHENSIVE
COLONY COUNT: NO GROWTH
Organism ID, Bacteria: NO GROWTH

## 2014-02-01 LAB — CYTOLOGY - PAP

## 2014-02-13 ENCOUNTER — Other Ambulatory Visit: Payer: Self-pay | Admitting: Obstetrics & Gynecology

## 2014-02-13 DIAGNOSIS — Z3682 Encounter for antenatal screening for nuchal translucency: Secondary | ICD-10-CM

## 2014-03-01 ENCOUNTER — Ambulatory Visit (HOSPITAL_COMMUNITY): Payer: Medicaid Other | Attending: Obstetrics & Gynecology

## 2014-03-01 ENCOUNTER — Ambulatory Visit (HOSPITAL_COMMUNITY): Payer: Medicaid Other

## 2014-03-04 ENCOUNTER — Encounter: Payer: Medicaid Other | Admitting: Advanced Practice Midwife

## 2014-03-15 ENCOUNTER — Ambulatory Visit (INDEPENDENT_AMBULATORY_CARE_PROVIDER_SITE_OTHER): Payer: Medicaid Other | Admitting: Advanced Practice Midwife

## 2014-03-15 VITALS — BP 133/68 | HR 91 | Wt 146.0 lb

## 2014-03-15 DIAGNOSIS — F2089 Other schizophrenia: Secondary | ICD-10-CM | POA: Insufficient documentation

## 2014-03-15 DIAGNOSIS — Z3400 Encounter for supervision of normal first pregnancy, unspecified trimester: Secondary | ICD-10-CM

## 2014-03-15 DIAGNOSIS — Z3402 Encounter for supervision of normal first pregnancy, second trimester: Secondary | ICD-10-CM | POA: Insufficient documentation

## 2014-03-15 NOTE — Progress Notes (Signed)
Pt states she fell 2 weeks ago and landed on abd - had vaginal bleeding for a couple of hours but it stopped

## 2014-03-15 NOTE — Progress Notes (Signed)
Pt tearful, upset appearing. Stormed out office initially, then returned w/ FOB. He was seen punching seat. After pt returned she was seen by CNM w/out FOB present. Denied DV/unsafe living conditions multiple times. Reports frequent falls due to to clumsiness. Had VB two weeks ago. No eval. Has known SCH. Pos FHTs today. Exam deferred. Reports schizophrenia. No meds. Has been hospitalized in the past for same. [facility]. Discussed risk of destabilization of MH conditions during pregnancy and PP. Also discussed increased DV risk and resources. Pt states FOB doesn't hit her and is suprortive of pregnancy. Denies SI/HI. Initially not making eye contact, but did at the end of visit. Seemed appreciative of staff concern for her. No indication for emergency intervention  Dorethea ClanLisa [place]ruzella [place] [place]  [facility] [place] for [place]. Gave pt resources for food stamps, food pantries, emergency mental health line. Will see pt monthly during pregnancy.

## 2014-03-15 NOTE — Patient Instructions (Signed)
[facility]  Second Trimester of Pregnancy The second trimester is from week 13 through week 28, months 4 through 6. The second trimester is often a time when you feel your best. Your body has also adjusted to being pregnant, and you begin to feel better physically. Usually, morning sickness has lessened or quit completely, you may have more energy, and you may have an increase in appetite. The second trimester is also a time when the fetus is growing rapidly. At the end of the sixth month, the fetus is about 9 inches long and weighs about 1 pounds. You will likely begin to feel the baby move (quickening) between 18 and 20 weeks of the pregnancy. BODY CHANGES Your body goes through many changes during pregnancy. The changes vary from woman to woman.   Your weight will continue to increase. You will notice your lower abdomen bulging out.  You may begin to get stretch marks on your hips, abdomen, and breasts.  You may develop headaches that can be relieved by medicines approved by your health care provider.  You may urinate more often because the fetus is pressing on your bladder.  You may develop or continue to have heartburn as a result of your pregnancy.  You may develop constipation because certain hormones are causing the muscles that push waste through your intestines to slow down.  You may develop hemorrhoids or swollen, bulging veins (varicose veins).  You may have back pain because of the weight gain and pregnancy hormones relaxing your joints between the bones in your pelvis and as a result of a shift in weight and the muscles that support your balance.  Your breasts will continue to grow and be tender.  Your gums may bleed and may be sensitive to brushing and flossing.  Dark spots or blotches (chloasma, mask of pregnancy) may develop on your face. This will likely fade after the baby is born.  A dark line from your belly button to the pubic  area (linea nigra) may appear. This will likely fade after the baby is born.  You may have changes in your hair. These can include thickening of your hair, rapid growth, and changes in texture. Some women also have hair loss during or after pregnancy, or hair that feels dry or thin. Your hair will most likely return to normal after your baby is born. WHAT TO EXPECT AT YOUR PRENATAL VISITS During a routine prenatal visit:  You will be weighed to make sure you and the fetus are growing normally.  Your blood pressure will be taken.  Your abdomen will be measured to track your baby's growth.  The fetal heartbeat will be listened to.  Any test results from the previous visit will be discussed. Your health care provider may ask you:  How you are feeling.  If you are feeling the baby move.  If you have had any abnormal symptoms, such as leaking fluid, bleeding, severe headaches, or abdominal cramping.  If you have any questions. Other tests that may be performed during your second trimester include:  Blood tests that check for:  Low iron levels (anemia).  Gestational diabetes (between 24 and 28 weeks).  Rh antibodies.  Urine tests to check for infections, diabetes, or protein in the urine.  An ultrasound to confirm the proper growth and development of the baby.  An amniocentesis to check for possible genetic problems.  Fetal screens for spina bifida and Down syndrome. HOME CARE INSTRUCTIONS  Avoid all smoking, herbs, alcohol, and unprescribed drugs. These chemicals affect the formation and growth of the baby.  Follow your health care provider's instructions regarding medicine use. There are medicines that are either safe or unsafe to take during pregnancy.  Exercise only as directed by your health care provider. Experiencing uterine cramps is a good sign to stop exercising.  Continue to eat regular, healthy meals.  Wear a good support bra for breast tenderness.  Do not  use hot tubs, steam rooms, or saunas.  Wear your seat belt at all times when driving.  Avoid raw meat, uncooked cheese, cat litter boxes, and soil used by cats. These carry germs that can cause birth defects in the baby.  Take your prenatal vitamins.  Try taking a stool softener (if your health care provider approves) if you develop constipation. Eat more high-fiber foods, such as fresh vegetables or fruit and whole grains. Drink plenty of fluids to keep your urine clear or pale yellow.  Take warm sitz baths to soothe any pain or discomfort caused by hemorrhoids. Use hemorrhoid cream if your health care provider approves.  If you develop varicose veins, wear support hose. Elevate your feet for 15 minutes, 3-4 times a day. Limit salt in your diet.  Avoid heavy lifting, wear low heel shoes, and practice good posture.  Rest with your legs elevated if you have leg cramps or low back pain.  Visit your dentist if you have not gone yet during your pregnancy. Use a soft toothbrush to brush your teeth and be gentle when you floss.  A sexual relationship may be continued unless your health care provider directs you otherwise.  Continue to go to all your prenatal visits as directed by your health care provider. SEEK MEDICAL CARE IF:   You have dizziness.  You have mild pelvic cramps, pelvic pressure, or nagging pain in the abdominal area.  You have persistent nausea, vomiting, or diarrhea.  You have a bad smelling vaginal discharge.  You have pain with urination. SEEK IMMEDIATE MEDICAL CARE IF:   You have a fever.  You are leaking fluid from your vagina.  You have spotting or bleeding from your vagina.  You have severe abdominal cramping or pain.  You have rapid weight gain or loss.  You have shortness of breath with chest pain.  You notice sudden or extreme swelling of your face, hands, ankles, feet, or legs.  You have not felt your baby move in over an hour.  You have  severe headaches that do not go away with medicine.  You have vision changes. Document Released: 07/27/2001 Document Revised: 08/07/2013 Document Reviewed: 10/03/2012 [facility] Patient Information 2015 [place]. This information is not intended to replace advice given to you by your health care provider. Make sure you discuss any questions you have with your health care provider.  If You Are the Victim of Domestic Violence THE POLICE CAN HELP YOU:  Get to a safe place away from the violence.  Get information on how the court can help protect you against the violence.  Get medical care for injuries you or your children may have.  Get necessary belongings from your home for you and your children.  Get copies of police reports about the violence.  File a complaint in criminal court.  Find where local criminal and family courts are located. THE COURTS CAN HELP YOU  If the person who harmed or threatened you is a family member or someone you have had  a child with, then you have the right to take your case to the criminal courts, the [employer], or both.  If you and the abuser are not related, were not ever married, and do not have a child in common, then your case can be heard only in the criminal court.  The forms you need are available from the [facility] and the criminal court.  The courts can decide to provide a temporary order of protection for:  You.  Your children.  Any witnesses who may request one.  The [employer] may appoint a lawyer to help you in court if it is found that you cannot afford one.  The Family Court may order temporary child support and temporary custody of your children. LAWS VARY FROM STATE TO STATE. YOU WILL NEED TO CHECK THE LAWS IN YOUR STATE.  You may request that the law enforcement officer assist in:  Providing for your safety and that of your children. This includes providing information on how to obtain a temporary order of  protection.  Obtaining essential personal property.  Locating and taking you and your children to a safe place within the officer's jurisdiction. This includes but is not limited to a domestic violence program, a family member's or a friend's residence, or a similar place of safety.  Obtaining medical treatment for you and your children.  When the officer's jurisdiction is more than a single county, you may ask the officer to take you or make arrangements to take you and your children to a place of safety in the county where the incident occurred.  You may request a copy of any incident reports at no cost from the law enforcement agency.  You have the right to seek legal counsel of your own choosing. If you proceed in family court and if it is determined that you cannot afford an attorney one must be appointed to represent you without cost to you.  You may ask the district attorney or a [employer] to file a criminal complaint. You also have the right to have your petition and request for an order of protection filed on the same day you appear in court. Such request must be heard that same day or the next day court is in session.  Either court may issue an order of protection from conduct constituting a family offense. This could include an order for the respondent or defendant to stay away from you and your children.  If the family court is not in session, you may seek immediate assistance from the criminal court in obtaining an order of protection. The forms you need to obtain an order of protection are available from the family court and the local criminal court. Note that filing a criminal complaint or a family court petition containing allegations (claims) that are knowingly false is a crime. Call your local domestic violence program for additional information and support. Document Released: 10/23/2003 Document Revised: 10/25/2011 Document Reviewed: 06/12/2007 [facility] Patient  Information 2015 [place], [place]. This information is not intended to replace advice given to you by your health care provider. Make sure you discuss any questions you have with your health care provider.

## 2014-04-12 ENCOUNTER — Ambulatory Visit (HOSPITAL_COMMUNITY)
Admission: RE | Admit: 2014-04-12 | Discharge: 2014-04-12 | Disposition: A | Discharge status: Home / Self Care | Payer: Medicaid Other | Source: Ambulatory Visit | Attending: Advanced Practice Midwife | Admitting: Advanced Practice Midwife

## 2014-04-12 ENCOUNTER — Encounter: Payer: Self-pay | Admitting: Advanced Practice Midwife

## 2014-04-12 DIAGNOSIS — O99810 Abnormal glucose complicating pregnancy: Secondary | ICD-10-CM | POA: Diagnosis present

## 2014-04-12 DIAGNOSIS — Z3689 Encounter for other specified antenatal screening: Secondary | ICD-10-CM | POA: Diagnosis not present

## 2014-04-12 DIAGNOSIS — O09529 Supervision of elderly multigravida, unspecified trimester: Secondary | ICD-10-CM | POA: Diagnosis not present

## 2014-04-12 DIAGNOSIS — Z1389 Encounter for screening for other disorder: Secondary | ICD-10-CM | POA: Diagnosis not present

## 2014-04-12 DIAGNOSIS — Z3402 Encounter for supervision of normal first pregnancy, second trimester: Secondary | ICD-10-CM

## 2014-04-12 DIAGNOSIS — Z3490 Encounter for supervision of normal pregnancy, unspecified, unspecified trimester: Secondary | ICD-10-CM | POA: Insufficient documentation

## 2014-04-12 DIAGNOSIS — IMO0002 Reserved for concepts with insufficient information to code with codable children: Secondary | ICD-10-CM

## 2014-04-12 DIAGNOSIS — O24410 Gestational diabetes mellitus in pregnancy, diet controlled: Secondary | ICD-10-CM

## 2014-04-12 DIAGNOSIS — Z363 Encounter for antenatal screening for malformations: Secondary | ICD-10-CM | POA: Insufficient documentation

## 2014-04-15 ENCOUNTER — Encounter: Payer: Medicaid Other | Admitting: Advanced Practice Midwife

## 2014-04-26 ENCOUNTER — Encounter: Payer: Self-pay | Admitting: *Deleted

## 2014-06-03 ENCOUNTER — Telehealth: Payer: Self-pay | Admitting: *Deleted

## 2014-06-03 NOTE — Telephone Encounter (Signed)
Pt was mailed a certified letter  On 04/26/14 about her missed OB appts.  Letter came back return to sender unable to forward.

## 2014-06-07 ENCOUNTER — Inpatient Hospital Stay (HOSPITAL_COMMUNITY): Payer: Medicaid Other

## 2014-06-07 ENCOUNTER — Inpatient Hospital Stay (HOSPITAL_COMMUNITY)
Admission: AD | Admit: 2014-06-07 | Discharge: 2014-06-07 | Discharge status: Against Medical Advice | Payer: Medicaid Other | Source: Ambulatory Visit | Attending: Obstetrics & Gynecology | Admitting: Obstetrics & Gynecology

## 2014-06-07 ENCOUNTER — Encounter (HOSPITAL_COMMUNITY): Payer: Self-pay | Admitting: General Practice

## 2014-06-07 DIAGNOSIS — R109 Unspecified abdominal pain: Secondary | ICD-10-CM | POA: Insufficient documentation

## 2014-06-07 DIAGNOSIS — Z3A26 26 weeks gestation of pregnancy: Secondary | ICD-10-CM | POA: Diagnosis not present

## 2014-06-07 DIAGNOSIS — O4692 Antepartum hemorrhage, unspecified, second trimester: Secondary | ICD-10-CM | POA: Diagnosis present

## 2014-06-07 DIAGNOSIS — Z3A27 27 weeks gestation of pregnancy: Secondary | ICD-10-CM

## 2014-06-07 LAB — URINE MICROSCOPIC-ADD ON

## 2014-06-07 LAB — URINALYSIS, ROUTINE W REFLEX MICROSCOPIC
Bilirubin Urine: NEGATIVE
GLUCOSE, UA: NEGATIVE mg/dL
KETONES UR: NEGATIVE mg/dL
Leukocytes, UA: NEGATIVE
Nitrite: NEGATIVE
PH: 6.5 (ref 5.0–8.0)
PROTEIN: NEGATIVE mg/dL
Specific Gravity, Urine: 1.01 (ref 1.005–1.030)
Urobilinogen, UA: 0.2 mg/dL (ref 0.0–1.0)

## 2014-06-07 MED ORDER — NIFEDIPINE 10 MG PO CAPS
10.0000 mg | ORAL_CAPSULE | Freq: Once | ORAL | Status: AC
  Administered 2014-06-07: 10 mg via ORAL
  Filled 2014-06-07: qty 1

## 2014-06-07 NOTE — MAU Note (Signed)
Started bleeding on Monday,  1 a.m. On Wed morinng went to the e.d.- was put on bedrest. light bleeding this morning- very faint.   Cramping in lower abd started on Monday.  Living in [place] now, [place] [facility] [place] [facility] [place]

## 2014-06-07 NOTE — MAU Note (Signed)
rn came into room to give pt procardia. Pt was laying on her side in fetal position, crying. When RN asked pt what was wrong, she stated she wanted to go home.

## 2014-06-07 NOTE — MAU Note (Signed)
Pt became irritable and frustrated with RN when it was explained to patient that continued fetal monitoring was recommended due to the amount of irritability on fetal monitor strip and early gestational age. Pt then turned her face away and did not look at RN any further. She seemed angry and was tearful. Her boyfriend said they had some time and she relented to stay for monitoring.

## 2014-06-07 NOTE — Discharge Instructions (Signed)
Before Baby Comes Home °Ask any questions about feeding, diapering, and baby care before you leave the hospital. Ask again if you do not understand. Ask when you need to see the doctor again. °There are several things you must have before your baby comes home. °· Infant car seat. °· Crib. °¨ Do not let your baby sleep in a bed with you or anyone else. °¨ If you do not have a bed for your baby, ask the doctor what you can use that will be safe for the baby to sleep in. °Infant feeding supplies: °· 6 to 8 bottles (8 ounce size). °· 6 to 8 nipples. °· Measuring cup. °· Measuring tablespoon. °· Bottle brush. °· Sterilizer (or use any large pan or kettle with a lid). °· Formula that contains iron. °· A way to boil and cool water. °Breastfeeding supplies: °· Breast pump. °· Nipple cream. °Clothing: °· 24 to 36 cloth diapers and waterproof diaper covers or a box of disposable diapers. You may need as many as 10 to 12 diapers per day. °· 3 onesies (other clothing will depend on the time of year and the weather). °· 3 receiving blankets. °· 3 baby pajamas or gowns. °· 3 bibs. °Bath equipment: °· Mild soap. °· Petroleum jelly. No baby oil or powder. °· Soft cloth towel and washcloth. °· Cotton balls. °· Separate bath basin for baby. Only sponge bathe until umbilical cord and circumcision are healed. °Other supplies: °· Thermometer and bulb syringe (ask the hospital to send them home with you). Ask your doctor about how you should take your baby's temperature. °· One to two pacifiers. °Prepare for an emergency: °· Know how to get to the hospital and know where to admit your baby. °· Put all doctor numbers near your house phone and in your cell phone if you have one. °Prepare your family: °· Talk with siblings about the baby coming home and how they feel about it. °· Decide how you want to handle visitors and other family members. °· Take offers for help with the baby. You will need time to adjust. °Know when to call the  doctor.  °GET HELP RIGHT AWAY IF: °· Your baby's temperature is greater than 100.4°F (38°C). °· The soft spot on your baby's head starts to bulge. °· Your baby is crying with no tears or has no wet diapers for 6 hours. °· Your baby has rapid breathing. °· Your baby is not as alert. °Document Released: 07/15/2008 Document Revised: 12/17/2013 Document Reviewed: 10/22/2010 °ExitCare® Patient Information ©2015 Exit[employer], LLC. This information is not intended to replace advice given to you by your health care provider. Make sure you discuss any questions you have with your health care provider. ° °

## 2014-06-07 NOTE — MAU Provider Note (Signed)
Chief Complaint:  Vaginal Bleeding and Abdominal Pain   [name] is a [age] y.o.  G6P0050 with IUP at 4844w6d presenting for Vaginal Bleeding and Abdominal Pain . Patient states she has been having  none contractions, moderate vaginal bleeding, intact membranes, with active fetal movement.  Pt has had minimal prenatal care and is currently homeless. Pt states that has been having period like bleeding and cramping for the past 5 days. States that the past couple of days it has gotten slightly better. States that today she was having spotting only.  She denies fever, dysuria, dishcharge, trauma.    Menstrual History: OB History   Grav Para Term Preterm Abortions TAB SAB Ect Mult Living   6    5  5           Patient's last menstrual period was 12/01/2013.      Past Medical History  Diagnosis Date  . Habitual aborter, currently pregnant   . Abnormal Pap smear of cervix 2012    History reviewed. No pertinent past surgical history.  Family History  Problem Relation Age of Onset  . Hypertension Mother   . Hypertension Maternal Grandmother   . Hypertension Maternal Grandfather     History  Substance Use Topics  . Smoking status: Former Smoker -- 1.00 packs/day for 11 years  . Smokeless tobacco: Never Used  . Alcohol Use: No     No Known Allergies  Prescriptions prior to admission  Medication Sig Dispense Refill  . Prenatal Vit-Fe Fumarate-FA (PRENATAL MULTIVITAMIN) TABS tablet Take 1 tablet by mouth daily at 12 noon.        Review of Systems - Negative except for what is mentioned in HPI.  Physical Exam  Blood pressure 133/74, pulse 96, temperature 98.3 F (36.8 C), temperature source Oral, resp. rate 20, height 5\' 8"  (1.727 m), weight 78.019 kg (172 lb), last menstrual period 12/01/2013. GENERAL: Well-developed, well-nourished female in no acute distress.  LUNGS: Clear to auscultation bilaterally.  HEART: Regular rate and rhythm. ABDOMEN: Soft, nontender,  nondistended, gravid.  EXTREMITIES: Nontender, no edema, 2+ distal pulses. Dilation: Closed Effacement (%): Thick Cervical Position: Posterior Station: -3 Exam by:: Alycia RossettiKoch, md   FHT:  Baseline rate 140 bpm   Variability moderate  Accelerations present   Decelerations none Contractions:None. Increased excitability of uterus on nst.    Labs: Results for orders placed during the hospital encounter of 06/07/14 (from the past 24 hour(s))  URINALYSIS, ROUTINE W REFLEX MICROSCOPIC   Collection Time    06/07/14 12:00 PM      Result Value Ref Range   Color, Urine YELLOW  YELLOW   APPearance CLEAR  CLEAR   Specific Gravity, Urine 1.010  1.005 - 1.030   pH 6.5  5.0 - 8.0   Glucose, UA NEGATIVE  NEGATIVE mg/dL   Hgb urine dipstick TRACE (*) NEGATIVE   Bilirubin Urine NEGATIVE  NEGATIVE   Ketones, ur NEGATIVE  NEGATIVE mg/dL   Protein, ur NEGATIVE  NEGATIVE mg/dL   Urobilinogen, UA 0.2  0.0 - 1.0 mg/dL   Nitrite NEGATIVE  NEGATIVE   Leukocytes, UA NEGATIVE  NEGATIVE  URINE MICROSCOPIC-ADD ON   Collection Time    06/07/14 12:00 PM      Result Value Ref Range   Squamous Epithelial / LPF FEW (*) RARE   RBC / HPF 0-2  <3 RBC/hpf   Bacteria, UA RARE  RARE    Imaging Studies:  [place] Ob Limited  06/07/2014   OBSTETRICAL ULTRASOUND:  This exam was performed within a  Ultrasound Department. The OB US report was generated in the AS system, and faxed to the ordering physician.   This report is available in the [employer] PACS. See the [facility] Obstetric US report via the Image Link.   Assessment: [name] is  23 y.o. G6P0050 at 7284w6d presents with Vaginal Bleeding and Abdominal Pain .  Plan: #Gestation of 884w6d w limited prenatal care --Sent request for appointment w [facility] --Limited U/S wnl --Pt will be seen at earliest possible convenience to re-establish prenatal care --Pt admitted to Marijuana use recently.  #Vaginal bleeding --UA w trace Hb and negative RBC --U/S w/o  signs of placenta previa nor abruption --Speculum exam w/o evidence of blood --G/C sent  #FWB --NST reactive.  --Uterine excitability; administered procardia however pt decided to leave AMA prior to post-dose evaluation.   Pt requested to leave AMA. Advised pt that she will be called by clinic to make appointment and we hope to see her there.  Aldona BarKoch, Kari L 10/23/20153:28 PM  Seen also by me. Agree with note Patient was told we need to complete evaluation and treatment for the safety of her baby Encouraged to followup in clinic Pelvic rest and PTL precautions Aviva SignsMarie L Mikhael Hendriks, CNM

## 2014-06-08 LAB — URINE CULTURE
CULTURE: NO GROWTH
Colony Count: NO GROWTH

## 2014-06-08 LAB — GC/CHLAMYDIA PROBE AMP
CT PROBE, AMP APTIMA: NEGATIVE
GC PROBE AMP APTIMA: NEGATIVE

## 2014-06-17 ENCOUNTER — Encounter (HOSPITAL_COMMUNITY): Payer: Self-pay | Admitting: General Practice

## 2014-06-27 ENCOUNTER — Encounter: Payer: Self-pay | Admitting: *Deleted

## 2014-06-27 ENCOUNTER — Encounter: Payer: Self-pay | Admitting: Obstetrics and Gynecology

## 2014-06-27 ENCOUNTER — Ambulatory Visit (INDEPENDENT_AMBULATORY_CARE_PROVIDER_SITE_OTHER): Payer: Medicaid Other | Admitting: Obstetrics and Gynecology

## 2014-06-27 VITALS — BP 113/83 | HR 92 | Ht 68.5 in | Wt 176.5 lb

## 2014-06-27 DIAGNOSIS — Z3493 Encounter for supervision of normal pregnancy, unspecified, third trimester: Secondary | ICD-10-CM

## 2014-06-27 DIAGNOSIS — F2089 Other schizophrenia: Secondary | ICD-10-CM

## 2014-06-27 LAB — CBC
HCT: 29.8 % — ABNORMAL LOW (ref 36.0–46.0)
HEMOGLOBIN: 10.3 g/dL — AB (ref 12.0–15.0)
MCH: 28.2 pg (ref 26.0–34.0)
MCHC: 34.6 g/dL (ref 30.0–36.0)
MCV: 81.6 fL (ref 78.0–100.0)
PLATELETS: 280 10*3/uL (ref 150–400)
RBC: 3.65 MIL/uL — AB (ref 3.87–5.11)
RDW: 13.9 % (ref 11.5–15.5)
WBC: 11.5 10*3/uL — AB (ref 4.0–10.5)

## 2014-06-27 MED ORDER — PRENATAL MULTIVITAMIN CH: 1.0000 | ORAL_TABLET | Freq: Every day | ORAL | Status: DC

## 2014-06-27 MED ORDER — TETANUS-DIPHTH-ACELL PERTUSSIS 5-2.5-18.5 LF-MCG/0.5 IM SUSP: 0.5000 mL | Freq: Once | INTRAMUSCULAR | Status: DC

## 2014-06-27 MED ORDER — PRENATAL VITAMINS 0.8 MG PO TABS: 1.0000 | ORAL_TABLET | Freq: Every day | ORAL | Status: DC

## 2014-06-27 NOTE — Progress Notes (Signed)
[place]. Patient is doing well without complaints. FM/PTL precautions reviewed. 1hr GCT, labs, Tdap today

## 2014-06-27 NOTE — Progress Notes (Signed)
Patient reports cramps on her right side.

## 2014-06-27 NOTE — Progress Notes (Signed)
Pt refused to provide urine sample today and therefore unable to send for Prescription Monitor Profile- will obtain @ next visit 11/25.

## 2014-06-27 NOTE — Progress Notes (Signed)
Nutrition note: 1st visit consult Pt has gained 46.5# @ 339w5d, which is > expected. Pt reports eating ~2x/d due to lack of access to food. Pt was taking PNV but ran out & so hasn't been taking lately. Pt reports no N/V but has some heartburn. Pt received verbal & written education on general nutrition during pregnancy. Discussed tips to decrease heartburn. Encouraged PNV. Discussed wt gain goals of 25-35# or 1#/wk. Pt agrees to start taking PNV again. Pt does not have WIC but plans to apply. Pt plans to BF. F/u as needed Blondell RevealLaura Malayja Freund, MS, RD, LDN, [facility]

## 2014-06-28 LAB — GLUCOSE TOLERANCE, 1 HOUR

## 2014-06-28 LAB — RPR

## 2014-06-28 LAB — HIV ANTIBODY (ROUTINE TESTING W REFLEX): HIV 1&2 Ab, 4th Generation: NONREACTIVE

## 2014-07-01 ENCOUNTER — Other Ambulatory Visit: Payer: Self-pay

## 2014-07-01 LAB — GLUCOSE TOLERANCE, 1 HOUR (50G) W/O FASTING: GLUCOSE 1 HOUR GTT: 107 mg/dL (ref 70–140)

## 2014-07-01 MED ORDER — PNV PRENATAL PLUS MULTIVITAMIN 27-1 MG PO TABS: 1.0000 | ORAL_TABLET | Freq: Every day | ORAL | Status: AC

## 2014-07-10 ENCOUNTER — Ambulatory Visit (INDEPENDENT_AMBULATORY_CARE_PROVIDER_SITE_OTHER): Payer: Medicaid Other | Admitting: Obstetrics & Gynecology

## 2014-07-10 VITALS — BP 125/75 | HR 87 | Temp 98.4°F | Wt 181.2 lb

## 2014-07-10 DIAGNOSIS — Z3493 Encounter for supervision of normal pregnancy, unspecified, third trimester: Secondary | ICD-10-CM

## 2014-07-10 NOTE — Patient Instructions (Signed)
Third Trimester of Pregnancy The third trimester is from week 29 through week 42, months 7 through 9. The third trimester is a time when the fetus is growing rapidly. At the end of the ninth month, the fetus is about 20 inches in length and weighs 6-10 pounds.  BODY CHANGES Your body goes through many changes during pregnancy. The changes vary from woman to woman.   Your weight will continue to increase. You can expect to gain 25-35 pounds (11-16 kg) by the end of the pregnancy.  You may begin to get stretch marks on your hips, abdomen, and breasts.  You may urinate more often because the fetus is moving lower into your pelvis and pressing on your bladder.  You may develop or continue to have heartburn as a result of your pregnancy.  You may develop constipation because certain hormones are causing the muscles that push waste through your intestines to slow down.  You may develop hemorrhoids or swollen, bulging veins (varicose veins).  You may have pelvic pain because of the weight gain and pregnancy hormones relaxing your joints between the bones in your pelvis. Backaches may result from overexertion of the muscles supporting your posture.  You may have changes in your hair. These can include thickening of your hair, rapid growth, and changes in texture. Some women also have hair loss during or after pregnancy, or hair that feels dry or thin. Your hair will most likely return to normal after your baby is born.  Your breasts will continue to grow and be tender. A yellow discharge may leak from your breasts called colostrum.  Your belly button may stick out.  You may feel short of breath because of your expanding uterus.  You may notice the fetus "dropping," or moving lower in your abdomen.  You may have a bloody mucus discharge. This usually occurs a few days to a week before labor begins.  Your cervix becomes thin and soft (effaced) near your due date. WHAT TO EXPECT AT YOUR PRENATAL  EXAMS  You will have prenatal exams every 2 weeks until week 36. Then, you will have weekly prenatal exams. During a routine prenatal visit:  You will be weighed to make sure you and the fetus are growing normally.  Your blood pressure is taken.  Your abdomen will be measured to track your baby's growth.  The fetal heartbeat will be listened to.  Any test results from the previous visit will be discussed.  You may have a cervical check near your due date to see if you have effaced. At around 36 weeks, your caregiver will check your cervix. At the same time, your caregiver will also perform a test on the secretions of the vaginal tissue. This test is to determine if a type of bacteria, Group B streptococcus, is present. Your caregiver will explain this further. Your caregiver may ask you:  What your birth plan is.  How you are feeling.  If you are feeling the baby move.  If you have had any abnormal symptoms, such as leaking fluid, bleeding, severe headaches, or abdominal cramping.  If you have any questions. Other tests or screenings that may be performed during your third trimester include:  Blood tests that check for low iron levels (anemia).  Fetal testing to check the health, activity level, and growth of the fetus. Testing is done if you have certain medical conditions or if there are problems during the pregnancy. FALSE LABOR You may feel small, irregular contractions that   eventually go away. These are called Braxton Hicks contractions, or false labor. Contractions may last for hours, days, or even weeks before true labor sets in. If contractions come at regular intervals, intensify, or become painful, it is best to be seen by your caregiver.  SIGNS OF LABOR   Menstrual-like cramps.  Contractions that are 5 minutes apart or less.  Contractions that start on the top of the uterus and spread down to the lower abdomen and back.  A sense of increased pelvic pressure or back  pain.  A watery or bloody mucus discharge that comes from the vagina. If you have any of these signs before the 37th week of pregnancy, call your caregiver right away. You need to go to the hospital to get checked immediately. HOME CARE INSTRUCTIONS   Avoid all smoking, herbs, alcohol, and unprescribed drugs. These chemicals affect the formation and growth of the baby.  Follow your caregiver's instructions regarding medicine use. There are medicines that are either safe or unsafe to take during pregnancy.  Exercise only as directed by your caregiver. Experiencing uterine cramps is a good sign to stop exercising.  Continue to eat regular, healthy meals.  Wear a good support bra for breast tenderness.  Do not use hot tubs, steam rooms, or saunas.  Wear your seat belt at all times when driving.  Avoid raw meat, uncooked cheese, cat litter boxes, and soil used by cats. These carry germs that can cause birth defects in the baby.  Take your prenatal vitamins.  Try taking a stool softener (if your caregiver approves) if you develop constipation. Eat more high-fiber foods, such as fresh vegetables or fruit and whole grains. Drink plenty of fluids to keep your urine clear or pale yellow.  Take warm sitz baths to soothe any pain or discomfort caused by hemorrhoids. Use hemorrhoid cream if your caregiver approves.  If you develop varicose veins, wear support hose. Elevate your feet for 15 minutes, 3-4 times a day. Limit salt in your diet.  Avoid heavy lifting, wear low heal shoes, and practice good posture.  Rest a lot with your legs elevated if you have leg cramps or low back pain.  Visit your dentist if you have not gone during your pregnancy. Use a soft toothbrush to brush your teeth and be gentle when you floss.  A sexual relationship may be continued unless your caregiver directs you otherwise.  Do not travel far distances unless it is absolutely necessary and only with the approval  of your caregiver.  Take prenatal classes to understand, practice, and ask questions about the labor and delivery.  Make a trial run to the hospital.  Pack your hospital bag.  Prepare the baby's nursery.  Continue to go to all your prenatal visits as directed by your caregiver. SEEK MEDICAL CARE IF:  You are unsure if you are in labor or if your water has broken.  You have dizziness.  You have mild pelvic cramps, pelvic pressure, or nagging pain in your abdominal area.  You have persistent nausea, vomiting, or diarrhea.  You have a bad smelling vaginal discharge.  You have pain with urination. SEEK IMMEDIATE MEDICAL CARE IF:   You have a fever.  You are leaking fluid from your vagina.  You have spotting or bleeding from your vagina.  You have severe abdominal cramping or pain.  You have rapid weight loss or gain.  You have shortness of breath with chest pain.  You notice sudden or extreme swelling   of your face, hands, ankles, feet, or legs.  You have not felt your baby move in over an hour.  You have severe headaches that do not go away with medicine.  You have vision changes. Document Released: 07/27/2001 Document Revised: 08/07/2013 Document Reviewed: 10/03/2012 Exit[employer] Patient Information 2015 [employer]. This information is not intended to replace advice given to you by your health care provider. Make sure you discuss any questions you have with your health care provider.  

## 2014-07-10 NOTE — Progress Notes (Signed)
Reports intermittent pelvic pressure.  Discussed tdap-- patient would like to discuss with partner and decide next visit. Information sheet given.

## 2014-07-10 NOTE — Progress Notes (Signed)
Considering Tdap. No complaints,

## 2014-07-21 ENCOUNTER — Encounter (HOSPITAL_COMMUNITY): Payer: Self-pay | Admitting: *Deleted

## 2014-07-21 ENCOUNTER — Inpatient Hospital Stay (HOSPITAL_COMMUNITY)
Admission: AD | Admit: 2014-07-21 | Discharge: 2014-07-21 | Disposition: A | Discharge status: Home / Self Care | Payer: Medicaid Other | Source: Ambulatory Visit | Attending: Family Medicine | Admitting: Family Medicine

## 2014-07-21 DIAGNOSIS — Z3A33 33 weeks gestation of pregnancy: Secondary | ICD-10-CM | POA: Diagnosis not present

## 2014-07-21 DIAGNOSIS — Z87891 Personal history of nicotine dependence: Secondary | ICD-10-CM | POA: Diagnosis not present

## 2014-07-21 DIAGNOSIS — O4703 False labor before 37 completed weeks of gestation, third trimester: Secondary | ICD-10-CM

## 2014-07-21 DIAGNOSIS — R109 Unspecified abdominal pain: Secondary | ICD-10-CM | POA: Diagnosis present

## 2014-07-21 DIAGNOSIS — O479 False labor, unspecified: Secondary | ICD-10-CM

## 2014-07-21 MED ORDER — NIFEDIPINE 10 MG PO CAPS
10.0000 mg | ORAL_CAPSULE | ORAL | Status: DC | PRN
  Filled 2014-07-21: qty 1

## 2014-07-21 NOTE — MAU Note (Signed)
Abdomen is soft to touch

## 2014-07-21 NOTE — Discharge Instructions (Signed)
Braxton Hicks Contractions °Contractions of the uterus can occur throughout pregnancy. Contractions are not always a sign that you are in labor.  °WHAT ARE BRAXTON HICKS CONTRACTIONS?  °Contractions that occur before labor are called Braxton Hicks contractions, or false labor. Toward the end of pregnancy (32-34 weeks), these contractions can develop more often and may become more forceful. This is not true labor because these contractions do not result in opening (dilatation) and thinning of the cervix. They are sometimes difficult to tell apart from true labor because these contractions can be forceful and people have different pain tolerances. You should not feel embarrassed if you go to the hospital with false labor. Sometimes, the only way to tell if you are in true labor is for your health care provider to look for changes in the cervix. °If there are no prenatal problems or other health problems associated with the pregnancy, it is completely safe to be sent home with false labor and await the onset of true labor. °HOW CAN YOU TELL THE DIFFERENCE BETWEEN TRUE AND FALSE LABOR? °False Labor °· The contractions of false labor are usually shorter and not as hard as those of true labor.   °· The contractions are usually irregular.   °· The contractions are often felt in the front of the lower abdomen and in the groin.   °· The contractions may go away when you walk around or change positions while lying down.   °· The contractions get weaker and are shorter lasting as time goes on.   °· The contractions do not usually become progressively stronger, regular, and closer together as with true labor.   °True Labor °· Contractions in true labor last 30-70 seconds, become very regular, usually become more intense, and increase in frequency.   °· The contractions do not go away with walking.   °· The discomfort is usually felt in the top of the uterus and spreads to the lower abdomen and low back.   °· True labor can be  determined by your health care provider with an exam. This will show that the cervix is dilating and getting thinner.   °WHAT TO REMEMBER °· Keep up with your usual exercises and follow other instructions given by your health care provider.   °· Take medicines as directed by your health care provider.   °· Keep your regular prenatal appointments.   °· Eat and drink lightly if you think you are going into labor.   °· If Braxton Hicks contractions are making you uncomfortable:   °¨ Change your position from lying down or resting to walking, or from walking to resting.   °¨ Sit and rest in a tub of warm water.   °¨ Drink 2-3 glasses of water. Dehydration may cause these contractions.   °¨ Do slow and deep breathing several times an hour.   °WHEN SHOULD I SEEK IMMEDIATE MEDICAL CARE? °Seek immediate medical care if: °· Your contractions become stronger, more regular, and closer together.   °· You have fluid leaking or gushing from your vagina.   °· You have a fever.   °· You pass blood-tinged mucus.   °· You have vaginal bleeding.   °· You have continuous abdominal pain.   °· You have low back pain that you never had before.   °· You feel your baby's head pushing down and causing pelvic pressure.   °· Your baby is not moving as much as it used to.   °Document Released: 08/02/2005 Document Revised: 08/07/2013 Document Reviewed: 05/14/2013 °ExitCare® Patient Information ©2015 [employer]. This information is not intended to replace advice given to you by your health care   provider. Make sure you discuss any questions you have with your health care provider. ° °

## 2014-07-21 NOTE — MAU Provider Note (Signed)
  History     CSN: [id number]  Arrival date and time: 07/21/14 [id number]   First Provider Initiated Contact with Patient 07/21/14 0245      Chief Complaint  Patient presents with  . Contractions   HPI  Pt is a 23 yo G6P0050 at 3641w1d wks IUP here with report of cramping for 2 wks. Does not know when contractions got stronger. Denies LOF or bleeding.  Reports the contractions are "random".  Denies abnormal vaginal discharge or UTI symptoms.    Past Medical History  Diagnosis Date  . Habitual aborter, currently pregnant   . Abnormal Pap smear of cervix 2012    History reviewed. No pertinent past surgical history.  Family History  Problem Relation Age of Onset  . Hypertension Mother   . Hypertension Maternal Grandmother   . Hypertension Maternal Grandfather     History  Substance Use Topics  . Smoking status: Former Smoker -- 1.00 packs/day for 11 years  . Smokeless tobacco: Never Used  . Alcohol Use: No    Allergies: No Known Allergies  Prescriptions prior to admission  Medication Sig Dispense Refill Last Dose  . Prenatal Vit-Fe Fumarate-FA (PNV PRENATAL PLUS MULTIVITAMIN) 27-1 MG TABS Take 1 tablet by mouth daily. 30 tablet 12 Taking    Review of Systems  Gastrointestinal: Positive for abdominal pain (contractions).  All other systems reviewed and are negative.  Physical Exam   Blood pressure 132/74, temperature 97.3 F (36.3 C), resp. rate 22, height 5\' 8"  (1.727 m), weight 83.915 kg (185 lb), last menstrual period 12/01/2013.  Physical Exam  Constitutional: She is oriented to person, place, and time. She appears well-developed and well-nourished. No distress.  Sleeping when I entered room  HENT:  Head: Normocephalic.  Neck: Normal range of motion. Neck supple.  Cardiovascular: Normal rate, regular rhythm and normal heart sounds.   Respiratory: Effort normal and breath sounds normal.  GI: Soft. There is no tenderness.  Genitourinary: No bleeding in the vagina.  Vaginal discharge (mucusy) found.  Neurological: She is alert and oriented to person, place, and time.  Skin: Skin is warm and dry.   Dilation: Closed Effacement (%): Thick Cervical Position: Posterior Station: Ballotable Presentation: Undeterminable Exam by:: K.WIilson,RN  FHR 120's, +accels Toco 2-10  0250 Pt declines additional cervical exam and desires discharge home; unable to void and declines staying to urinate MAU Course  Procedures    Assessment and Plan  23 yo G6P0050 at 1241w1d wks IUP Category I FHR Tracing Braxton Hicks  Plan: Discharge to home Reviewed signs of preterm labor Increase PO fluids Keep scheduled appointment on 07/24/14   Marlis EdelsonKARIM, WALIDAH N 07/21/2014, 2:46 AM

## 2014-07-21 NOTE — MAU Note (Signed)
Cramping for 2 wks. Does not know when contractions got stronger. Denies LOF or bleeding

## 2014-07-25 ENCOUNTER — Encounter: Payer: Self-pay | Admitting: Family Medicine

## 2014-07-25 ENCOUNTER — Encounter: Payer: Medicaid Other | Admitting: Family Medicine

## 2014-08-06 ENCOUNTER — Inpatient Hospital Stay (HOSPITAL_COMMUNITY)
Admission: AD | Admit: 2014-08-06 | Discharge: 2014-08-06 | Disposition: A | Discharge status: Home / Self Care | Payer: Medicaid Other | Source: Ambulatory Visit | Attending: Family Medicine | Admitting: Family Medicine

## 2014-08-06 ENCOUNTER — Encounter (HOSPITAL_COMMUNITY): Payer: Self-pay | Admitting: *Deleted

## 2014-08-06 DIAGNOSIS — O212 Late vomiting of pregnancy: Secondary | ICD-10-CM | POA: Diagnosis not present

## 2014-08-06 DIAGNOSIS — Z3A35 35 weeks gestation of pregnancy: Secondary | ICD-10-CM | POA: Diagnosis not present

## 2014-08-06 DIAGNOSIS — R109 Unspecified abdominal pain: Secondary | ICD-10-CM | POA: Insufficient documentation

## 2014-08-06 DIAGNOSIS — F1290 Cannabis use, unspecified, uncomplicated: Secondary | ICD-10-CM

## 2014-08-06 DIAGNOSIS — Z3493 Encounter for supervision of normal pregnancy, unspecified, third trimester: Secondary | ICD-10-CM

## 2014-08-06 LAB — URINE MICROSCOPIC-ADD ON

## 2014-08-06 LAB — URINALYSIS, ROUTINE W REFLEX MICROSCOPIC
Bilirubin Urine: NEGATIVE
GLUCOSE, UA: NEGATIVE mg/dL
Hgb urine dipstick: NEGATIVE
KETONES UR: 15 mg/dL — AB
LEUKOCYTES UA: NEGATIVE
NITRITE: NEGATIVE
PROTEIN: 30 mg/dL — AB
Specific Gravity, Urine: 1.025 (ref 1.005–1.030)
Urobilinogen, UA: 2 mg/dL — ABNORMAL HIGH (ref 0.0–1.0)
pH: 6.5 (ref 5.0–8.0)

## 2014-08-06 NOTE — Discharge Instructions (Signed)
Constipation °Constipation is when a person has fewer than three bowel movements a week, has difficulty having a bowel movement, or has stools that are dry, hard, or larger than normal. As people grow older, constipation is more common. If you try to fix constipation with medicines that make you have a bowel movement (laxatives), the problem may get worse. Long-term laxative use may cause the muscles of the colon to become weak. A low-fiber diet, not taking in enough fluids, and taking certain medicines may make constipation worse.  °CAUSES  °· Certain medicines, such as antidepressants, pain medicine, iron supplements, antacids, and water pills.   °· Certain diseases, such as diabetes, irritable bowel syndrome (IBS), thyroid disease, or depression.   °· Not drinking enough water.   °· Not eating enough fiber-rich foods.   °· Stress or travel.   °· Lack of physical activity or exercise.   °· Ignoring the urge to have a bowel movement.   °· Using laxatives too much.   °SIGNS AND SYMPTOMS  °· Having fewer than three bowel movements a week.   °· Straining to have a bowel movement.   °· Having stools that are hard, dry, or larger than normal.   °· Feeling full or bloated.   °· Pain in the lower abdomen.   °· Not feeling relief after having a bowel movement.   °DIAGNOSIS  °Your health care provider will take a medical history and perform a physical exam. Further testing may be done for severe constipation. Some tests may include: °· A barium enema X-ray to examine your rectum, colon, and, sometimes, your small intestine.   °· A sigmoidoscopy to examine your lower colon.   °· A colonoscopy to examine your entire colon. °TREATMENT  °Treatment will depend on the severity of your constipation and what is causing it. Some dietary treatments include drinking more fluids and eating more fiber-rich foods. Lifestyle treatments may include regular exercise. If these diet and lifestyle recommendations do not help, your health care  provider may recommend taking over-the-counter laxative medicines to help you have bowel movements. Prescription medicines may be prescribed if over-the-counter medicines do not work.  °HOME CARE INSTRUCTIONS  °· Eat foods that have a lot of fiber, such as fruits, vegetables, whole grains, and beans. °· Limit foods high in fat and processed sugars, such as french fries, hamburgers, cookies, candies, and soda.   °· A fiber supplement may be added to your diet if you cannot get enough fiber from foods.   °· Drink enough fluids to keep your urine clear or pale yellow.   °· Exercise regularly or as directed by your health care provider.   °· Go to the restroom when you have the urge to go. Do not hold it.   °· Only take over-the-counter or prescription medicines as directed by your health care provider. Do not take other medicines for constipation without talking to your health care provider first.   °SEEK IMMEDIATE MEDICAL CARE IF:  °· You have bright red blood in your stool.   °· Your constipation lasts for more than 4 days or gets worse.   °· You have abdominal or rectal pain.   °· You have thin, pencil-like stools.   °· You have unexplained weight loss. °MAKE SURE YOU:  °· Understand these instructions. °· Will watch your condition. °· Will get help right away if you are not doing well or get worse. °Document Released: 04/30/2004 Document Revised: 08/07/2013 Document Reviewed: 05/14/2013 °ExitCare® Patient Information ©2015 [employer]. This information is not intended to replace advice given to you by your health care provider. Make sure you discuss any questions   you have with your health care provider. Braxton Hicks Contractions Contractions of the uterus can occur throughout pregnancy. Contractions are not always a sign that you are in labor.  WHAT ARE BRAXTON HICKS CONTRACTIONS?  Contractions that occur before labor are called Braxton Hicks contractions, or false labor. Toward the end of pregnancy (32-34  weeks), these contractions can develop more often and may become more forceful. This is not true labor because these contractions do not result in opening (dilatation) and thinning of the cervix. They are sometimes difficult to tell apart from true labor because these contractions can be forceful and people have different pain tolerances. You should not feel embarrassed if you go to the hospital with false labor. Sometimes, the only way to tell if you are in true labor is for your health care provider to look for changes in the cervix. If there are no prenatal problems or other health problems associated with the pregnancy, it is completely safe to be sent home with false labor and await the onset of true labor. HOW CAN YOU TELL THE DIFFERENCE BETWEEN TRUE AND FALSE LABOR? False Labor  The contractions of false labor are usually shorter and not as hard as those of true labor.   The contractions are usually irregular.   The contractions are often felt in the front of the lower abdomen and in the groin.   The contractions may go away when you walk around or change positions while lying down.   The contractions get weaker and are shorter lasting as time goes on.   The contractions do not usually become progressively stronger, regular, and closer together as with true labor.  True Labor  Contractions in true labor last 30-70 seconds, become very regular, usually become more intense, and increase in frequency.   The contractions do not go away with walking.   The discomfort is usually felt in the top of the uterus and spreads to the lower abdomen and low back.   True labor can be determined by your health care provider with an exam. This will show that the cervix is dilating and getting thinner.  WHAT TO REMEMBER  Keep up with your usual exercises and follow other instructions given by your health care provider.   Take medicines as directed by your health care provider.   Keep  your regular prenatal appointments.   Eat and drink lightly if you think you are going into labor.   If Braxton Hicks contractions are making you uncomfortable:   Change your position from lying down or resting to walking, or from walking to resting.   Sit and rest in a tub of warm water.   Drink 2-3 glasses of water. Dehydration may cause these contractions.   Do slow and deep breathing several times an hour.  WHEN SHOULD I SEEK IMMEDIATE MEDICAL CARE? Seek immediate medical care if:  Your contractions become stronger, more regular, and closer together.   You have fluid leaking or gushing from your vagina.   You have a fever.   You pass blood-tinged mucus.   You have vaginal bleeding.   You have continuous abdominal pain.   You have low back pain that you never had before.   You feel your baby's head pushing down and causing pelvic pressure.   Your baby is not moving as much as it used to.  Document Released: 08/02/2005 Document Revised: 08/07/2013 Document Reviewed: 05/14/2013 [facility] Patient Information 2015 [place], [place]. This information is not intended to replace  advice given to you by your health care provider. Make sure you discuss any questions you have with your health care provider. ° °

## 2014-08-06 NOTE — Progress Notes (Signed)
Dr Adrian BlackwaterStinson on unit and aware D. Poe CNM had called back and could not see pt. Dr Adrian BlackwaterStinson asked RN to do sve which was done and reported to Dr Adrian BlackwaterStinson. Pt stable for d/c home

## 2014-08-06 NOTE — Progress Notes (Signed)
Written and verbal d/c instructions given and understanding voiced. 

## 2014-08-06 NOTE — Progress Notes (Signed)
[name]. Poe CNM notified of pt's admission and status. Aware of uterine activity of u/i but no ctxs, u/a results. Will see pt

## 2014-08-06 NOTE — MAU Note (Signed)
Patient states she has been having abdominal cramping for 3 days, has had vomiting for 2 days. Reports having vaginal bleeding while in the shower this am, none since. Reports good fetal movement.

## 2014-08-12 ENCOUNTER — Ambulatory Visit (INDEPENDENT_AMBULATORY_CARE_PROVIDER_SITE_OTHER): Payer: Medicaid Other | Admitting: Family Medicine

## 2014-08-12 VITALS — BP 116/67 | HR 83 | Temp 98.2°F | Wt 187.2 lb

## 2014-08-12 DIAGNOSIS — Z3493 Encounter for supervision of normal pregnancy, unspecified, third trimester: Secondary | ICD-10-CM

## 2014-08-12 LAB — POCT URINALYSIS DIP (DEVICE)
BILIRUBIN URINE: NEGATIVE
Glucose, UA: NEGATIVE mg/dL
HGB URINE DIPSTICK: NEGATIVE
KETONES UR: NEGATIVE mg/dL
NITRITE: NEGATIVE
PH: 7 (ref 5.0–8.0)
PROTEIN: NEGATIVE mg/dL
Specific Gravity, Urine: 1.02 (ref 1.005–1.030)
UROBILINOGEN UA: 1 mg/dL (ref 0.0–1.0)

## 2014-08-12 LAB — OB RESULTS CONSOLE GC/CHLAMYDIA
CHLAMYDIA, DNA PROBE: NEGATIVE
GC PROBE AMP, GENITAL: NEGATIVE

## 2014-08-12 LAB — OB RESULTS CONSOLE GBS: GBS: NEGATIVE

## 2014-08-12 MED ORDER — NITROFURANTOIN MONOHYD MACRO 100 MG PO CAPS: 100.0000 mg | ORAL_CAPSULE | Freq: Two times a day (BID) | ORAL | Status: DC

## 2014-08-12 NOTE — Progress Notes (Signed)
C/o burning with urination.  Declines Tdap today-- states she will consider it for next visit.  GBS and cultures today.

## 2014-08-12 NOTE — Progress Notes (Signed)
Patient is 23 y.o. [id number] 5087w2d.  +FM, denies LOF, VB, vaginal discharge; occasional contractions - GBS and G/C today - discussed TDAP, will consider at next visit - dysuria, feels she has a bladder infection => macrobid 100mg  BID

## 2014-08-13 LAB — GC/CHLAMYDIA PROBE AMP
CT PROBE, AMP APTIMA: NEGATIVE
GC PROBE AMP APTIMA: NEGATIVE

## 2014-08-14 LAB — CULTURE, BETA STREP (GROUP B ONLY)

## 2014-08-16 NOTE — L&D Delivery Note (Signed)
Delivery Note At 8:30 PM a viable female was delivered via  (Presentation: OA ).  APGAR: 9, 9; weight  pending.   Placenta status:spontaneous,intact .  Cord: 3v  with the following complications:  none .  Cord pH: na  Anesthesia:  epidural Episiotomy:  na Lacerations:  2nd degree perineal Suture Repair: 3.0 vicryl Est. Blood Loss (mL):  200 ml  Mom to postpartum.  Baby to Couplet care / Skin to Skin.  Rolm BookbinderMoss, Amber 09/17/2014, 8:53 PM  Attended delivery also No difficulty with shoulders Agree with note  Aviva SignsMarie L Sinaya Minogue, CNM

## 2014-08-21 ENCOUNTER — Encounter: Payer: Self-pay | Admitting: Advanced Practice Midwife

## 2014-08-21 ENCOUNTER — Telehealth: Payer: Self-pay | Admitting: Advanced Practice Midwife

## 2014-08-21 ENCOUNTER — Encounter: Payer: Medicaid Other | Admitting: Advanced Practice Midwife

## 2014-08-21 NOTE — Telephone Encounter (Signed)
Called pt recording stated that caller unavailable. Mailing certified letter missed ob appointment 08/21/2014.

## 2014-08-27 ENCOUNTER — Encounter: Payer: Self-pay | Admitting: General Practice

## 2014-09-08 ENCOUNTER — Encounter (HOSPITAL_COMMUNITY): Payer: Self-pay | Admitting: *Deleted

## 2014-09-08 ENCOUNTER — Inpatient Hospital Stay (HOSPITAL_COMMUNITY)
Admission: AD | Admit: 2014-09-08 | Discharge: 2014-09-08 | Disposition: A | Discharge status: Home / Self Care | Payer: Medicaid Other | Source: Ambulatory Visit | Attending: Obstetrics and Gynecology | Admitting: Obstetrics and Gynecology

## 2014-09-08 DIAGNOSIS — O471 False labor at or after 37 completed weeks of gestation: Secondary | ICD-10-CM | POA: Insufficient documentation

## 2014-09-08 DIAGNOSIS — Z3A40 40 weeks gestation of pregnancy: Secondary | ICD-10-CM | POA: Insufficient documentation

## 2014-09-08 HISTORY — DX: Schizophrenia, unspecified: F20.9

## 2014-09-08 NOTE — MAU Note (Signed)
Pt states she missed her last two clinic appointments.

## 2014-09-08 NOTE — Discharge Instructions (Signed)
Braxton Hicks Contractions °Contractions of the uterus can occur throughout pregnancy. Contractions are not always a sign that you are in labor.  °WHAT ARE BRAXTON HICKS CONTRACTIONS?  °Contractions that occur before labor are called Braxton Hicks contractions, or false labor. Toward the end of pregnancy (32-34 weeks), these contractions can develop more often and may become more forceful. This is not true labor because these contractions do not result in opening (dilatation) and thinning of the cervix. They are sometimes difficult to tell apart from true labor because these contractions can be forceful and people have different pain tolerances. You should not feel embarrassed if you go to the hospital with false labor. Sometimes, the only way to tell if you are in true labor is for your health care provider to look for changes in the cervix. °If there are no prenatal problems or other health problems associated with the pregnancy, it is completely safe to be sent home with false labor and await the onset of true labor. °HOW CAN YOU TELL THE DIFFERENCE BETWEEN TRUE AND FALSE LABOR? °False Labor °· The contractions of false labor are usually shorter and not as hard as those of true labor.   °· The contractions are usually irregular.   °· The contractions are often felt in the front of the lower abdomen and in the groin.   °· The contractions may go away when you walk around or change positions while lying down.   °· The contractions get weaker and are shorter lasting as time goes on.   °· The contractions do not usually become progressively stronger, regular, and closer together as with true labor.   °True Labor °· Contractions in true labor last 30-70 seconds, become very regular, usually become more intense, and increase in frequency.   °· The contractions do not go away with walking.   °· The discomfort is usually felt in the top of the uterus and spreads to the lower abdomen and low back.   °· True labor can be  determined by your health care provider with an exam. This will show that the cervix is dilating and getting thinner.   °WHAT TO REMEMBER °· Keep up with your usual exercises and follow other instructions given by your health care provider.   °· Take medicines as directed by your health care provider.   °· Keep your regular prenatal appointments.   °· Eat and drink lightly if you think you are going into labor.   °· If Braxton Hicks contractions are making you uncomfortable:   °¨ Change your position from lying down or resting to walking, or from walking to resting.   °¨ Sit and rest in a tub of warm water.   °¨ Drink 2-3 glasses of water. Dehydration may cause these contractions.   °¨ Do slow and deep breathing several times an hour.   °WHEN SHOULD I SEEK IMMEDIATE MEDICAL CARE? °Seek immediate medical care if: °· Your contractions become stronger, more regular, and closer together.   °· You have fluid leaking or gushing from your vagina.   °· You have a fever.    °· You have vaginal bleeding.   °· You have continuous abdominal pain.   °· You have low back pain that you never had before.   °· You feel your baby's head pushing down and causing pelvic pressure.   °· Your baby is not moving as much as it used to.   °Document Released: 08/02/2005 Document Revised: 08/07/2013 Document Reviewed: 05/14/2013 °Exit[employer]® Patient Information ©2015 [employer]. This information is not intended to replace advice given to you by your health care provider. Make sure you discuss any   questions you have with your health care provider. ° °

## 2014-09-08 NOTE — MAU Note (Signed)
Pt states she has had some intermittent uc's, is worried she is past her due date.  States she had bleeding a couple of days ago, none today.  Denies LOF.

## 2014-09-10 ENCOUNTER — Encounter (HOSPITAL_COMMUNITY): Payer: Self-pay | Admitting: General Practice

## 2014-09-10 ENCOUNTER — Inpatient Hospital Stay (HOSPITAL_COMMUNITY)
Admission: AD | Admit: 2014-09-10 | Discharge: 2014-09-10 | Discharge status: Against Medical Advice | Payer: Medicaid Other | Source: Ambulatory Visit | Attending: Family Medicine | Admitting: Family Medicine

## 2014-09-10 DIAGNOSIS — O368390 Maternal care for abnormalities of the fetal heart rate or rhythm, unspecified trimester, not applicable or unspecified: Secondary | ICD-10-CM

## 2014-09-10 DIAGNOSIS — O471 False labor at or after 37 completed weeks of gestation: Secondary | ICD-10-CM | POA: Diagnosis present

## 2014-09-10 DIAGNOSIS — Z3A40 40 weeks gestation of pregnancy: Secondary | ICD-10-CM | POA: Diagnosis not present

## 2014-09-10 NOTE — MAU Note (Signed)
Pt took her self off the monitor and started to walk off the unit. When patient was asked if she wanted to stay for her u/s she stated no and continued to walk out the door. Dr Loreta Aveacosta was updated.

## 2014-09-10 NOTE — Progress Notes (Signed)
Here for labor eval, missed last 2 clinic appointments.  No cervical change since last exam.  NST reactive with good variability however had couple of variables.  BPP ordered, discussed with patient as she was tearful requesting induction.  Subsequently left prior to sono.  Perry MountACOSTA,Demtrius Rounds ROCIO, MD 8:02 PM

## 2014-09-10 NOTE — MAU Note (Signed)
Pt presents to MAU with complaints of contractions that started this morning around 1030. Denies any vaginal bleeding or LOF.

## 2014-09-10 NOTE — MAU Note (Signed)
Pt had asked about being induced. Pt was told that she would need to speak with her doctors at her next appointment to see about being induced. Pt stated that she missed her last tow appointments because she had other things to do.

## 2014-09-10 NOTE — MAU Note (Signed)
Explained to patient that she would be going to u/s for a bpp because she was having decelerations of the baby's heart rate. Pt's visitor asked if she could be induced because she was past her due date. Explained to visitor and patient again that she would have to talk about induction at her next appointment. Pt started to sob and become distraught stating her appointment was next Tuesday, and she wanted the baby out of her. Dr. Loreta AveAcosta was updated about patients thoughts and behavior. Pt was seen by dr Loreta Aveacosta.

## 2014-09-17 ENCOUNTER — Inpatient Hospital Stay (HOSPITAL_COMMUNITY): Payer: Medicaid Other

## 2014-09-17 ENCOUNTER — Ambulatory Visit (INDEPENDENT_AMBULATORY_CARE_PROVIDER_SITE_OTHER): Payer: Medicaid Other | Admitting: Advanced Practice Midwife

## 2014-09-17 ENCOUNTER — Encounter (HOSPITAL_COMMUNITY): Payer: Self-pay | Admitting: *Deleted

## 2014-09-17 ENCOUNTER — Inpatient Hospital Stay (HOSPITAL_COMMUNITY): Payer: Medicaid Other | Admitting: Anesthesiology

## 2014-09-17 ENCOUNTER — Inpatient Hospital Stay (HOSPITAL_COMMUNITY)
Admission: AD | Admit: 2014-09-17 | Discharge: 2014-09-19 | DRG: 775 | Disposition: A | Discharge status: Home / Self Care | Payer: Medicaid Other | Source: Ambulatory Visit | Attending: Family Medicine | Admitting: Family Medicine

## 2014-09-17 ENCOUNTER — Inpatient Hospital Stay (HOSPITAL_COMMUNITY)
Admission: AD | Admit: 2014-09-17 | Discharge: 2014-09-17 | Discharge status: Against Medical Advice | Payer: Medicaid Other | Source: Ambulatory Visit | Attending: Family Medicine | Admitting: Family Medicine

## 2014-09-17 VITALS — BP 121/77 | HR 76 | Wt 208.1 lb

## 2014-09-17 DIAGNOSIS — O480 Post-term pregnancy: Secondary | ICD-10-CM

## 2014-09-17 DIAGNOSIS — Z3A41 41 weeks gestation of pregnancy: Secondary | ICD-10-CM | POA: Diagnosis not present

## 2014-09-17 DIAGNOSIS — IMO0001 Reserved for inherently not codable concepts without codable children: Secondary | ICD-10-CM

## 2014-09-17 DIAGNOSIS — Z5900 Homelessness unspecified: Secondary | ICD-10-CM

## 2014-09-17 DIAGNOSIS — O26849 Uterine size-date discrepancy, unspecified trimester: Secondary | ICD-10-CM | POA: Insufficient documentation

## 2014-09-17 DIAGNOSIS — Z3483 Encounter for supervision of other normal pregnancy, third trimester: Secondary | ICD-10-CM | POA: Diagnosis present

## 2014-09-17 DIAGNOSIS — O0930 Supervision of pregnancy with insufficient antenatal care, unspecified trimester: Secondary | ICD-10-CM

## 2014-09-17 DIAGNOSIS — O0933 Supervision of pregnancy with insufficient antenatal care, third trimester: Secondary | ICD-10-CM

## 2014-09-17 LAB — CBC
HCT: 34.4 % — ABNORMAL LOW (ref 36.0–46.0)
HEMOGLOBIN: 11.3 g/dL — AB (ref 12.0–15.0)
MCH: 27.4 pg (ref 26.0–34.0)
MCHC: 32.8 g/dL (ref 30.0–36.0)
MCV: 83.5 fL (ref 78.0–100.0)
PLATELETS: 273 10*3/uL (ref 150–400)
RBC: 4.12 MIL/uL (ref 3.87–5.11)
RDW: 14.5 % (ref 11.5–15.5)
WBC: 12.3 10*3/uL — ABNORMAL HIGH (ref 4.0–10.5)

## 2014-09-17 LAB — TYPE AND SCREEN
ABO/RH(D): A POS
ANTIBODY SCREEN: NEGATIVE

## 2014-09-17 LAB — POCT FERN TEST: POCT FERN TEST: POSITIVE

## 2014-09-17 LAB — ABO/RH: ABO/RH(D): A POS

## 2014-09-17 MED ORDER — SIMETHICONE 80 MG PO CHEW: 80.0000 mg | CHEWABLE_TABLET | ORAL | Status: DC | PRN

## 2014-09-17 MED ORDER — LACTATED RINGERS IV SOLN: 500.0000 mL | INTRAVENOUS | Status: DC | PRN

## 2014-09-17 MED ORDER — OXYCODONE-ACETAMINOPHEN 5-325 MG PO TABS
1.0000 | ORAL_TABLET | ORAL | Status: DC | PRN
  Administered 2014-09-18 (×2): 1 via ORAL
  Filled 2014-09-17 (×2): qty 1

## 2014-09-17 MED ORDER — LIDOCAINE HCL (PF) 1 % IJ SOLN
INTRAMUSCULAR | Status: DC | PRN
  Administered 2014-09-17 (×2): 8 mL

## 2014-09-17 MED ORDER — PRENATAL MULTIVITAMIN CH: 1.0000 | ORAL_TABLET | Freq: Every day | ORAL | Status: DC

## 2014-09-17 MED ORDER — OXYCODONE-ACETAMINOPHEN 5-325 MG PO TABS: 2.0000 | ORAL_TABLET | ORAL | Status: DC | PRN

## 2014-09-17 MED ORDER — ONDANSETRON HCL 4 MG PO TABS: 4.0000 mg | ORAL_TABLET | ORAL | Status: DC | PRN

## 2014-09-17 MED ORDER — EPHEDRINE 5 MG/ML INJ
10.0000 mg | INTRAVENOUS | Status: DC | PRN
  Filled 2014-09-17: qty 2

## 2014-09-17 MED ORDER — FLEET ENEMA 7-19 GM/118ML RE ENEM: 1.0000 | ENEMA | RECTAL | Status: DC | PRN

## 2014-09-17 MED ORDER — IBUPROFEN 600 MG PO TABS
600.0000 mg | ORAL_TABLET | Freq: Four times a day (QID) | ORAL | Status: DC
  Administered 2014-09-17 – 2014-09-19 (×6): 600 mg via ORAL
  Filled 2014-09-17 (×7): qty 1

## 2014-09-17 MED ORDER — LACTATED RINGERS IV SOLN: INTRAVENOUS | Status: DC

## 2014-09-17 MED ORDER — CITRIC ACID-SODIUM CITRATE 334-500 MG/5ML PO SOLN: 30.0000 mL | ORAL | Status: DC | PRN

## 2014-09-17 MED ORDER — DIBUCAINE 1 % RE OINT: 1.0000 "application " | TOPICAL_OINTMENT | RECTAL | Status: DC | PRN

## 2014-09-17 MED ORDER — BENZOCAINE-MENTHOL 20-0.5 % EX AERO
1.0000 "application " | INHALATION_SPRAY | CUTANEOUS | Status: DC | PRN
  Administered 2014-09-17: 1 via TOPICAL
  Filled 2014-09-17: qty 56

## 2014-09-17 MED ORDER — PHENYLEPHRINE 40 MCG/ML (10ML) SYRINGE FOR IV PUSH (FOR BLOOD PRESSURE SUPPORT)
80.0000 ug | PREFILLED_SYRINGE | INTRAVENOUS | Status: DC | PRN
  Filled 2014-09-17: qty 2
  Filled 2014-09-17: qty 20

## 2014-09-17 MED ORDER — PHENYLEPHRINE 40 MCG/ML (10ML) SYRINGE FOR IV PUSH (FOR BLOOD PRESSURE SUPPORT)
80.0000 ug | PREFILLED_SYRINGE | INTRAVENOUS | Status: DC | PRN
  Filled 2014-09-17: qty 2

## 2014-09-17 MED ORDER — TETANUS-DIPHTH-ACELL PERTUSSIS 5-2.5-18.5 LF-MCG/0.5 IM SUSP: 0.5000 mL | Freq: Once | INTRAMUSCULAR | Status: DC

## 2014-09-17 MED ORDER — ONDANSETRON HCL 4 MG/2ML IJ SOLN: 4.0000 mg | INTRAMUSCULAR | Status: DC | PRN

## 2014-09-17 MED ORDER — DIPHENHYDRAMINE HCL 50 MG/ML IJ SOLN: 12.5000 mg | INTRAMUSCULAR | Status: DC | PRN

## 2014-09-17 MED ORDER — OXYTOCIN BOLUS FROM INFUSION
500.0000 mL | INTRAVENOUS | Status: DC
  Administered 2014-09-17: 500 mL via INTRAVENOUS

## 2014-09-17 MED ORDER — ACETAMINOPHEN 325 MG PO TABS: 650.0000 mg | ORAL_TABLET | ORAL | Status: DC | PRN

## 2014-09-17 MED ORDER — LANOLIN HYDROUS EX OINT: TOPICAL_OINTMENT | CUTANEOUS | Status: DC | PRN

## 2014-09-17 MED ORDER — LIDOCAINE HCL (PF) 1 % IJ SOLN
30.0000 mL | INTRAMUSCULAR | Status: DC | PRN
  Filled 2014-09-17 (×2): qty 30

## 2014-09-17 MED ORDER — SENNOSIDES-DOCUSATE SODIUM 8.6-50 MG PO TABS
2.0000 | ORAL_TABLET | ORAL | Status: DC
  Administered 2014-09-17 – 2014-09-18 (×2): 2 via ORAL
  Filled 2014-09-17 (×2): qty 2
  Filled 2014-09-17: qty 1

## 2014-09-17 MED ORDER — OXYTOCIN 40 UNITS IN LACTATED RINGERS INFUSION - SIMPLE MED
62.5000 mL/h | INTRAVENOUS | Status: DC
  Filled 2014-09-17 (×2): qty 1000

## 2014-09-17 MED ORDER — LACTATED RINGERS IV SOLN: 500.0000 mL | Freq: Once | INTRAVENOUS | Status: DC

## 2014-09-17 MED ORDER — OXYCODONE-ACETAMINOPHEN 5-325 MG PO TABS: 1.0000 | ORAL_TABLET | ORAL | Status: DC | PRN

## 2014-09-17 MED ORDER — FENTANYL 2.5 MCG/ML BUPIVACAINE 1/10 % EPIDURAL INFUSION (WH - ANES)
INTRAMUSCULAR | Status: DC | PRN
  Administered 2014-09-17: 14 mL/h via EPIDURAL

## 2014-09-17 MED ORDER — WITCH HAZEL-GLYCERIN EX PADS: 1.0000 "application " | MEDICATED_PAD | CUTANEOUS | Status: DC | PRN

## 2014-09-17 MED ORDER — FENTANYL 2.5 MCG/ML BUPIVACAINE 1/10 % EPIDURAL INFUSION (WH - ANES)
14.0000 mL/h | INTRAMUSCULAR | Status: DC | PRN
  Administered 2014-09-17: 14 mL/h via EPIDURAL
  Filled 2014-09-17: qty 125

## 2014-09-17 MED ORDER — ONDANSETRON HCL 4 MG/2ML IJ SOLN: 4.0000 mg | Freq: Four times a day (QID) | INTRAMUSCULAR | Status: DC | PRN

## 2014-09-17 MED ORDER — PRENATAL MULTIVITAMIN CH
1.0000 | ORAL_TABLET | Freq: Every day | ORAL | Status: DC
  Administered 2014-09-18 – 2014-09-19 (×2): 1 via ORAL
  Filled 2014-09-17 (×2): qty 1

## 2014-09-17 MED ORDER — DIPHENHYDRAMINE HCL 25 MG PO CAPS: 25.0000 mg | ORAL_CAPSULE | Freq: Four times a day (QID) | ORAL | Status: DC | PRN

## 2014-09-17 MED ORDER — ZOLPIDEM TARTRATE 5 MG PO TABS: 5.0000 mg | ORAL_TABLET | Freq: Every evening | ORAL | Status: DC | PRN

## 2014-09-17 MED ORDER — PNV PRENATAL PLUS MULTIVITAMIN 27-1 MG PO TABS: 1.0000 | ORAL_TABLET | Freq: Every day | ORAL | Status: DC

## 2014-09-17 NOTE — H&P (Signed)
Chief Complaint:  Labor Eval  HPI: [name] is a 24 y.o. G6P0050 at [redacted]w[redacted]d who presents to maternity admissions reporting contractions and SROM. Patient was seen earlier today in the clinic due to decreased fundal heigh measurements. Patient was sent to the MAU. BBP and [place] were obtained. BPP 8/8 and AFI ~7.  Patient later left AMA. She presented back now w/ c/o her water breaking. [name] was positive. Patient contracting regularly.  Denies vaginal bleeding, fever, chills, n/v/d/c. Good fetal movement.   Patient states she used tobacco during her pregnancy "up until about 2 months ago". She also endorses THC w/ most recent use being 2 weeks ago.  Pregnancy Course:  Clinic  [facility]--> [facility]  Dating  LMP/19 week [place]  Genetic Screen 1 Screen:                 AFP:                    Quad:                  NIPS:  Anatomic [place]  Normal female  GTT  Third trimester: 107  TDaP vaccine Declined  Flu vaccine declined  GBS negative  Contraception declines  Baby Food Breast feed  Circumcision Desires, will investigate if boyfriend's/FOBs insurance covers child  Pediatrician   Support Person     Past Medical History: Past Medical History  Diagnosis Date  . Habitual aborter, currently pregnant   . Abnormal Pap smear of cervix 2012  . Schizophrenia     Past obstetric history: OB History  Gravida Para Term Preterm AB SAB TAB Ectopic Multiple Living  # Outcome Date GA Lbr Len/2nd Weight Sex Delivery Anes PTL Lv  6 Current           5 SAB           4 SAB           3 SAB           2 SAB           1 SAB               Past Surgical History: Past Surgical History  Procedure Laterality Date  . No past surgeries       Family History: Family History  Problem Relation Age of Onset  . Hypertension Mother   . Hypertension Maternal Grandmother   . Hypertension Maternal Grandfather     Social History: History  Substance Use Topics  . Smoking status: Former Smoker -- 1.00  packs/day for 11 years  . Smokeless tobacco: Never Used  . Alcohol Use: No    Allergies: No Known Allergies  Meds:  Prescriptions prior to admission  Medication Sig Dispense Refill Last Dose  . Prenatal Vit-Fe Fumarate-FA (PNV PRENATAL PLUS MULTIVITAMIN) 27-1 MG TABS Take 1 tablet by mouth daily. 30 tablet 12 Past Month at Unknown time  . nitrofurantoin, macrocrystal-monohydrate, (MACROBID) 100 MG capsule Take 1 capsule (100 mg total) by mouth 2 (two) times daily. (Patient not taking: Reported on 09/08/2014) 14 capsule 0 Not Taking at Unknown time    ROS: Pertinent findings in history of present illness.  Physical Exam  Blood pressure 148/88, pulse 82, temperature 97.8 F (36.6 C), temperature source Oral, resp. rate 22, last menstrual period 12/01/2013. GENERAL: Well-developed, well-nourished female in no acute distress. Obvious discomfort during contractions. HEENT: normocephalic  HEART: normal rate RESP: normal effort ABDOMEN: Soft, non-tender, gravid appropriate for gestational age EXTREMITIES: Nontender, no edema NEURO: alert and oriented  Dilation: 3.5 Effacement (%): 90 Cervical Position: Middle Station: -2 Presentation: Vertex Exam by:: [name]  FHT:  Baseline 120, moderate variability, accelerations present, no decelerations Contractions: q 3-4 mins   Labs: Results for orders placed or performed during the hospital encounter of 09/17/14 (from the past 24 hour(s))  Fern Test     Status: None   Collection Time: 09/17/14  4:49 PM  Result Value Ref Range   POCT Fern Test Positive = ruptured amniotic membanes     Imaging:  No results found.  Assessment: 1. Labor: active, SROM @ 1600 2. Fetal Wellbeing: Category 1  3. Pain Control: epidural desired 4. GBS: neg 5. 41.3 week IUP  Plan:  1. Admit to BS per consult with MD 2. Routine L&D orders 3. Analgesia/anesthesia PRN      Medication List    ASK your doctor about these medications         nitrofurantoin (macrocrystal-monohydrate) 100 MG capsule  Commonly known as:  MACROBID  Take 1 capsule (100 mg total) by mouth 2 (two) times daily.     PNV PRENATAL PLUS MULTIVITAMIN 27-1 MG Tabs  Take 1 tablet by mouth daily.        Kathee DeltonIan D McKeag, MD 09/17/2014 5:09 PM   CNM attestation:  I have seen and examined this patient; I agree with above documentation in the residents's note.   [name] is a 24 y.o. G6P0050 reporting SROM @ 1600 +FM,denies VB, having contractions, denies vaginal discharge.  PE: BP 145/89 mmHg  Pulse 76  Temp(Src) 97.4 F (36.3 C) (Oral)  Resp 22  Ht 5\' 9"  (1.753 m)  Wt 94.348 kg (208 lb)  BMI 30.70 kg/m2  LMP 12/01/2013 Gen: calm comfortable, NAD Resp: normal effort, no distress Abd: gravid  ROS, labs, PMH reviewed NST reactive Cat 1  Plan: - Admit BS - Epidural - Anticipate vaginal delivery  [name],[name], CNM 6:56 PM

## 2014-09-17 NOTE — Anesthesia Preprocedure Evaluation (Signed)
Anesthesia Evaluation  Patient identified by MRN, date of birth, ID band Patient awake    Reviewed: Allergy & Precautions, H&P , NPO status , Patient's Chart, lab work & pertinent test results  Airway Mallampati: I  TM Distance: >3 FB Neck ROM: full    Dental no notable dental hx.    Pulmonary former smoker,    Pulmonary exam normal       Cardiovascular negative cardio ROS      Neuro/Psych negative neurological ROS     GI/Hepatic negative GI ROS, Neg liver ROS,   Endo/Other  negative endocrine ROS  Renal/GU negative Renal ROS     Musculoskeletal   Abdominal Normal abdominal exam  (+)   Peds  Hematology negative hematology ROS (+)   Anesthesia Other Findings   Reproductive/Obstetrics (+) Pregnancy                             Anesthesia Physical Anesthesia Plan  ASA: II  Anesthesia Plan: Epidural   Post-op Pain Management:    Induction:   Airway Management Planned:   Additional Equipment:   Intra-op Plan:   Post-operative Plan:   Informed Consent: I have reviewed the patients History and Physical, chart, labs and discussed the procedure including the risks, benefits and alternatives for the proposed anesthesia with the patient or authorized representative who has indicated his/her understanding and acceptance.     Plan Discussed with:   Anesthesia Plan Comments:         Anesthesia Quick Evaluation  

## 2014-09-17 NOTE — Progress Notes (Signed)
Pt states that contractions started 0500 but have only been like at the least 15 mins apart

## 2014-09-17 NOTE — Anesthesia Procedure Notes (Signed)
Epidural Patient location during procedure: OB Start time: 09/17/2014 6:13 PM End time: 09/17/2014 6:17 PM  Staffing Anesthesiologist: Leilani AbleHATCHETT, Latima Hamza Performed by: anesthesiologist   Preanesthetic Checklist Completed: patient identified, surgical consent, pre-op evaluation, timeout performed, IV checked, risks and benefits discussed and monitors and equipment checked  Epidural Patient position: sitting Prep: site prepped and draped and DuraPrep Patient monitoring: continuous pulse ox and blood pressure Approach: midline Location: L3-L4 Injection technique: LOR air  Needle:  Needle type: Tuohy  Needle gauge: 17 G Needle length: 9 cm and 9 Needle insertion depth: 6 cm Catheter type: closed end flexible Catheter size: 19 Gauge Catheter at skin depth: 11 cm Test dose: negative and Other  Assessment Sensory level: T9 Events: blood not aspirated, injection not painful, no injection resistance, negative IV test and no paresthesia

## 2014-09-17 NOTE — MAU Note (Signed)
Sent from women's clinic, is post dates.   Having irreg contractions, worse early this morning.

## 2014-09-17 NOTE — MAU Note (Addendum)
?   Gush of fluid around 1600.  None is coming now. It contracting, waves of nausea when ctx hits

## 2014-09-17 NOTE — MAU Note (Signed)
Pt wants to sign out AMA, risks of deterioration of maternal & fetal wellbeing, possible fetal distress &/or death discussed with pt & her S/O.  Pt states she still wants to leave, AMA form signed.

## 2014-09-17 NOTE — MAU Note (Signed)
Pt back from U/S, pt fully dressed, does not want to go back on EFM, states she & her S/O borrowed someone else's car & they need to go.  Explained to pt we are waiting on U/S results, pt states she still needs to go.  Lorrie [name] CNM in to talk to pt.

## 2014-09-18 DIAGNOSIS — Z5900 Homelessness unspecified: Secondary | ICD-10-CM

## 2014-09-18 LAB — RAPID URINE DRUG SCREEN, HOSP PERFORMED
Amphetamines: NOT DETECTED
Barbiturates: NOT DETECTED
Benzodiazepines: NOT DETECTED
COCAINE: NOT DETECTED
Opiates: NOT DETECTED
TETRAHYDROCANNABINOL: POSITIVE — AB

## 2014-09-18 LAB — RPR: RPR Ser Ql: NONREACTIVE

## 2014-09-18 NOTE — Anesthesia Postprocedure Evaluation (Signed)
Anesthesia Post Note  Patient: Melissa ChampagneJessika Davia  Procedure(s) Performed: * No procedures listed *  Anesthesia type: Epidural  Patient location: Mother/Baby  Post pain: Pain level controlled  Post assessment: Post-op Vital signs reviewed  Last Vitals:  Filed Vitals:   09/18/14 0541  BP: 128/68  Pulse: 82  Temp: 36.8 C  Resp: 18    Post vital signs: Reviewed  Level of consciousness:alert  Complications: No apparent anesthesia complications

## 2014-09-18 NOTE — Progress Notes (Signed)
Clinical Social Work Department PSYCHOSOCIAL ASSESSMENT - MATERNAL/CHILD 09/18/2014  Patient:  [name], [name]  Account Number:  [id number]  Admit Date:  09/17/2014  [name] Name:   [name]   Clinical Social Worker:  [name], CLINICAL SOCIAL WORKER   Date/Time:  09/18/2014 01:45 PM  Date Referred:  09/17/2014   Referral source  Central Nursery  Physician     Referred reason  Homelessness  [facility]  Behavioral Health Issues  Substance Abuse   Other referral source:    I:  FAMILY / HOME ENVIRONMENT Child's legal guardian:  PARENT  Guardian - Name Guardian - Age Guardian - Address  [name] 23 MOB reported that she is homeless  [name]     Other household support members/support persons Other support:   MOB reported that she has a sister, but discussed that she provides limited support.    II  PSYCHOSOCIAL DATA Information Source:  Patient Interview  Insurance risk surveyor Resources Employment:   MOB is unemployed. She receives SSI.   Financial resources:  Medicaid If Medicaid - County:  [place] / Grade:  N/A Music therapist / Child Services Coordination / Early Interventions:   None reported  Cultural issues impacting care:   None reported    III  STRENGTHS Strengths  Compliance with medical plan   Strength comment:    IV  RISK FACTORS AND CURRENT PROBLEMS Current Problem:  YES   Risk Factor & Current Problem Patient Issue Family Issue Risk Factor / Current Problem Comment  Basic Needs (food,housing,etc) Y N MOB denied having secure/stable housing. She stated that she has ability to pay for 2 nights at a hotel, but stated that she has no plans for living after those 2 days.  MOB reported that she is unable to pay for food 50% of the time.  Substance Abuse Y N MOB presents with THC use during pregnancy. MOB's UDS is positive for THC.  Baby's UDS and MDS are pending.  Mental Illness Y N MOB reported diagnosis of  paranoid schizophrenia.  MOB reported that has not been on medications since age 9, last hospital admission at age 36 (per MOB report).  MOB endorsed AVH early in pregnancy.    V  SOCIAL WORK ASSESSMENT CSW met with the MOB due to concerns about housing, [facility] use, and mental health needs.  MOB originally was receptive and agreeable to the CSW visit; however, CSW noted lack of eye contact.  At the beginning of the assessment, she was openly discussing her psychosocial stressors and displayed an appropriate range in affect, but by the end of the assessment, MOB was highly tearful/emotional, and requested that CSW leave the room so that she could be left alone.     MOB acknowledged that she had requested a CSW visit due to housing concerns.  She shared belief, "you probably won't be able to do anything about it".  CSW continued to explore her concerns, and MOB reported that she does not have a place to live/stay when discharged from this current hospital admission. She presents with chronic homelessness, and denied stable housing since 2014.  She stated that during the pregnancy, there were many nights when she was sleeping on the streets.  MOB shared that she moved into the FOB's mother's home in December 2015, but was informed that she was unable to return with the baby.  When CSW attempted to clarify reasons why she was unable to return, she stated that there was insufficient space  and was concerned that the baby may be exposed to carbon monoxide.  She stated that she "had a plan" until 2 weeks ago, and reflected upon her recent history of attempting to apply for her own apartment, but being denied since the FOB has a criminal records.  She stated that she then looses money with the application process, which makes it difficult for her to apply for new apartments and pay for security deposits.  Per MOB, she became aware 2 weeks ago that her apartment application had been denied, but has been unable to create a  new housing plan. MOB stated that she currently has enough money for 2 nights at a hotel once discharged from the hospital, but shared that she would likely need to stay in a shelter afterwards.  MOB denied knowledged/awareness of shelters that available in [place].  CSW offered to assist with information.   MOB identified lack of housing as her primary stressor.  She also endorsed lack of sufficient food 50% of the time.  She stated that she does not have food stamps since it would interfere with her "disability check".  MOB did not present with any concrete plans that would assist her to ensure that she has sufficient food and ability to pay for basic needs now that the baby is born.  MOB acknowledges these factors as significant stressors, and became highly tearful when she reflected upon her feelings secondary to these stressors.  She stated that she is unsure what is "next", and while she is happy that the baby has been born, she at times feels like it was not a smart decision to bring him into these stressors.  MOB did report having a car seat, clothing, and a place for the baby to sleep.   MOB presents with poor self-awareness related to her emotional regulation skills.  When asked how she has been able to cope with these stressors, she reported, "I don't know".  MOB was unable to identify what motivates her amongst stressors, or what may motivate her now that the baby has been born.  MOB endorsed diagnosis of paranoid schizophrenia since childhood.  She stated that she has not been on medications since age 78 due to them "not working".  She stated, "I' don't know what they put in meds for psychotic people, but it made me worse".  MOB presents with insight as she is able to verbalize that she has auditory and visual hallucinations.  She denied any hallucinations since the onset of the second trimester; however, she also acknowledged that she is a limited historian since she has "black outs". MOB stated  that she is unsure the frequency in which she has blacks outs or when she last had one since she is not aware when she has them.  MOB did not appear internally preoccupied during the visit. She did not present as paranoid, no delusions noted.  CSW heard a linear and goal oriented thought process during the visit.    Per MOB, infrequent THC use during the pregnancy.  She stated that she last used 2-3 weeks ago, and shared that she used [facility] for a period of time during the second trimester when she was homeless.  MOB acknowledged hospital drug screen policy, and denied any questions/concerns related to the policy.   CSW shared need to make referral to CPS due to lack of housing and concerns about untreated mental health.  MOB immediately became tearful, and stated that she did not want to talk  to CSW anymore.  MOB placed the baby back in the crib, and became more tearful/emotional.  CSW attempted to validate her feelings and to discuss goal of CPS referral, but MOB went into the bathroom and closed the door abruptly.  MOB continued to cry and state, "it's not my fault, it's hereditary, it's not my fault, why am I being punished when it's not my fault?".    CSW notified RN of MOB's response to CSW intervention.  RN also attempted to offer support, but MOB requested to be alone.   CSW will continue to closely follow.   VI SOCIAL WORK PLAN Social Work Plan  Child Scientist, forensic Report  Information/Referral to [employer]  Patient/Family Education   Type of pt/family education:   Hospital drug screen policy   If child protective services report - county:  [place] If child protective services report - date:  09/18/2014 Information/referral to community resources comment:   CSW will attempt to offer housing and mental health resources if MOB is agreeable for CSW follow up visit. CSW will collaborate with CPS in order to ensure appropriate referrals at time of discharge.    Other social  work plan:   CSW will closely follow and will collaborate with CPS to receive discharge recommendations.  There continue to be acute barriers to discharge, no discharge until CPS assessment.

## 2014-09-18 NOTE — Progress Notes (Signed)
Doing well.  Good fetal movement, denies vaginal bleeding, LOF. She does report regular contractions that are around 15 minutes apart.  She does not desire IOL today, she would rather schedule for tomorrow morning if possible. Size < dates today at 35 cm, send to MAU for NST and growth U/S.
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
## 2014-09-18 NOTE — Progress Notes (Signed)
Post Partum Day 1 Subjective: no complaints, up ad lib, voiding, tolerating PO and + flatus Requesting to see Social Work due to homelessness.  Breastfeeding and bonding well with baby.   Objective: Blood pressure 128/68, pulse 82, temperature 98.2 F (36.8 C), temperature source Oral, resp. rate 18, height 5\' 9"  (1.753 m), weight 208 lb (94.348 kg), last menstrual period 12/01/2013, SpO2 98 %, unknown if currently breastfeeding.  Physical Exam:  General: alert, cooperative and no distress Lochia: appropriate Uterine Fundus: firm Incision: healing well DVT Evaluation: No evidence of DVT seen on physical exam.   Recent Labs  09/17/14 1738  HGB 11.3*  HCT 34.4*    Assessment/Plan: Plan for discharge tomorrow and Social Work consult   LOS: 1 day   Brightiside SurgicalWILLIAMS,MARIE 09/18/2014, 6:04 AM

## 2014-09-18 NOTE — Progress Notes (Signed)
Patient expressed a request to have a different social worker speak with her for further contacts. Patient felt that she was ,"being blamed for something I have no control over." encouraged patient to explain her feelings, and reassured her that social service department would be notified with her request. Observed behavior of patient, with child care is anxious, but cooperative. She participated in swaddling, and shared concerns for her lack of suitable housing. FOB present off and on this evening. Patient's sister was here also to visit with and interact with patient and infant.

## 2014-09-19 ENCOUNTER — Ambulatory Visit: Payer: Self-pay

## 2014-09-19 MED ORDER — IBUPROFEN 600 MG PO TABS: 600.0000 mg | ORAL_TABLET | Freq: Four times a day (QID) | ORAL | Status: AC
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
## 2014-09-19 NOTE — Progress Notes (Signed)
UR chart review completed.  

## 2014-09-19 NOTE — Discharge Summary (Signed)
Obstetric Discharge Summary Reason for Admission: rupture of membranes Prenatal Procedures: ultrasound Intrapartum Procedures: spontaneous vaginal delivery Postpartum Procedures: none Complications-Operative and Postpartum: none   Patient presented with SROM and contractions, and progressed quickly to 2nd stage.  Pushed < 30 minutes & delivered a viable female infant.  Spontaneous delivery of intact placenta, & 2nd degree laceration repaired.  EBL WNL. Patient is breastfeeding, undecided about contraception, and desires O/P circumcision.  HEMOGLOBIN  Date Value Ref Range Status  09/17/2014 11.3* 12.0 - 15.0 g/dL Final   HCT  Date Value Ref Range Status  09/17/2014 34.4* 36.0 - 46.0 % Final    Physical Exam:  General: alert, cooperative, fatigued and no distress Lochia: appropriate Uterine Fundus: @U , firm Incision: n/a DVT Evaluation: No evidence of DVT seen on physical exam. Negative Homan's sign. No cords or calf tenderness. No significant calf/ankle edema.  Discharge Diagnoses: Term Pregnancy-delivered  Discharge Information: Date: 09/19/2014 Activity: pelvic rest Diet: routine Medications: PNV Condition: stable Instructions: refer to practice specific booklet Discharge to: home   Newborn Data: Live born female  Birth Weight: 5 lb 13.5 oz (2650 g) APGAR: 9, 9  Home with mother, pending CPS review.  Ross,Lisa Wynne 09/19/2014, 7:26 AM   I have seen and examined this patient and I agree with the above. SW working on d/c plans as pt situation is complicated by homelessness. Cam HaiSHAW, Yigit Norkus CNM 9:20 AM 09/19/2014

## 2014-09-19 NOTE — Discharge Instructions (Signed)

## 2014-09-19 NOTE — Plan of Care (Signed)
Problem: Discharge Progression Outcomes Goal: Complications resolved/controlled Outcome: Completed/Met Date Met:  09/19/14 Infant to remain as baby patient to continue to support infant care needs, lactation, and resolution of home care issues with Education officer, museum.

## 2014-09-19 NOTE — Progress Notes (Signed)
CSW spoke with Print production planner, [place] worker.  CPS reported that she met with the MOB this morning in order to complete the assessment. She reported that MOB was originally resistant to meeting with her, but became more agreeable once the FOB arrived.  CPS stated that the MOB and FOB now have the plan to stay with the paternal grandparents.  CSW inquired about change since the MOB had stated on 2/3 that she was not allowed to return to their home.  Per CPS, she is unsure what has changed, but she stated that both the MOB and FOB discussed/confirmed this plan.  CSW reviewed mental health concerns, including diagnosis of paranoid schizophrenia and "blacking out".  CPS stated that the MOB is receptive to completing a mental health evaluation and re-starting medications.  CPS shared that the FOB is currently unemployed and will be at home to supervise.   CPS aware that the baby is not medically ready for discharge.  CPS denied barriers to discharge. She reported intention to follow up with the family once discharged from the hospital in the paternal grandparents' home.
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
## 2014-09-19 NOTE — Progress Notes (Addendum)
CSW notified that CSX Corporationmber Telfair is the assigned Stage managerCPS worker.  CSW left voicemail for CPS worker in order to collaborate on case.  CSW will continue to follow.

## 2014-09-20 NOTE — Progress Notes (Signed)
I spent time offering spiritual and emotional support to [name] while she waited to see if her boyfriend's family would be able to provide them a place to stay. This was understandably a very anxious time for her and I provided reflective listening and pastoral presence as she processed.  Centex CorporationChaplain Katy Jemmie Rhinehart Pager, [phone] 12:51 PM    09/20/14 1200  Clinical Encounter Type  Visited With Patient;Patient and family together  Visit Type Spiritual support  Referral From Physician  Spiritual Encounters  Spiritual Needs Emotional  Stress Factors  Patient Stress Factors Loss of control;Financial concerns;Family relationships  ------------

## 2014-10-24 ENCOUNTER — Ambulatory Visit: Payer: Medicaid Other | Admitting: Family Medicine

## 2015-03-31 IMAGING — US US OB COMP +14 WK
1 series · 12 of 28 positions shown · non-contrast
Comparison: none

[Series 1: us ob comp +14 wk · 12 of 75 slices shown]
[im 3/75]
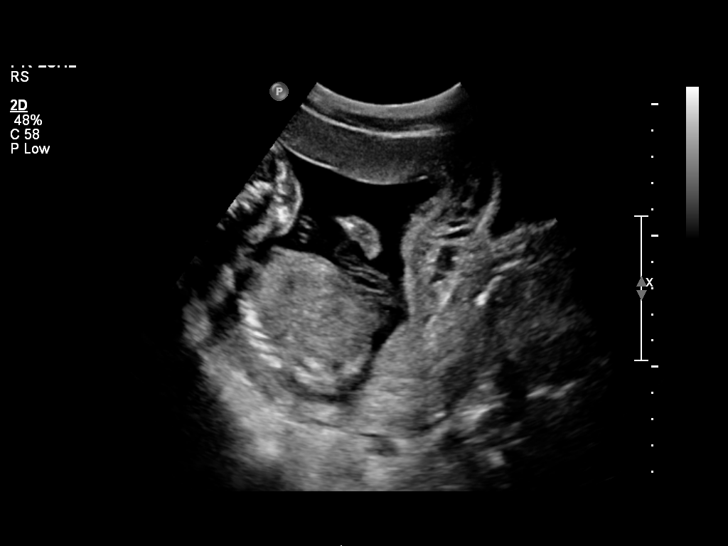
[im 9/75]
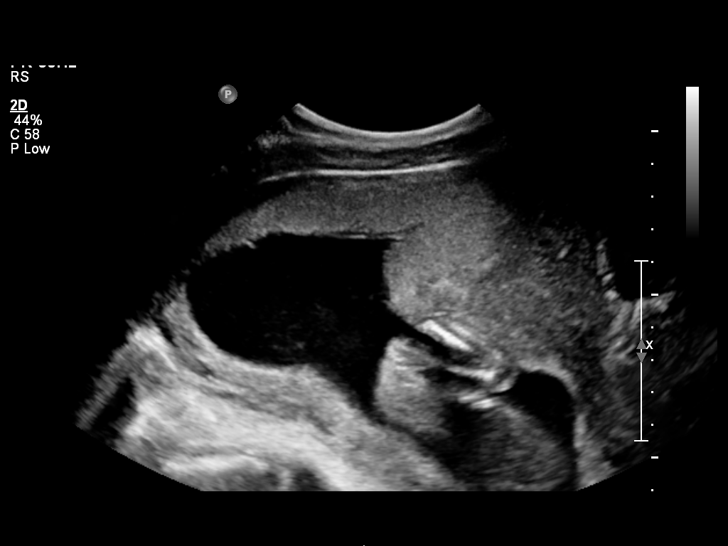
[im 14/75]
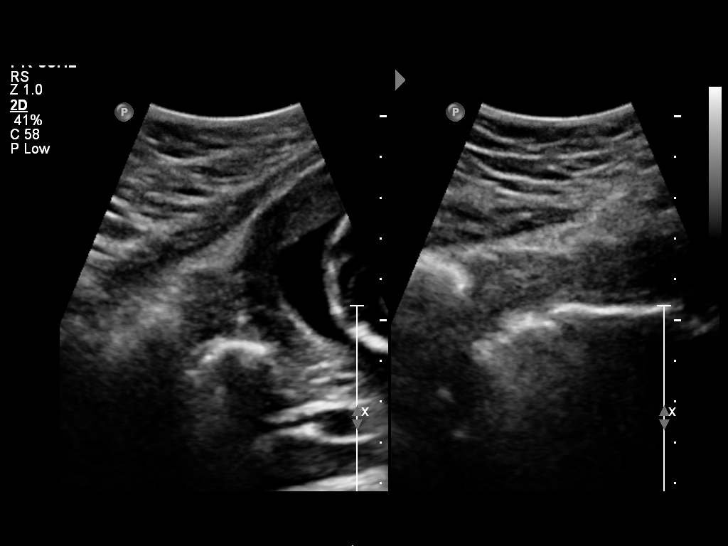
[im 22/75]
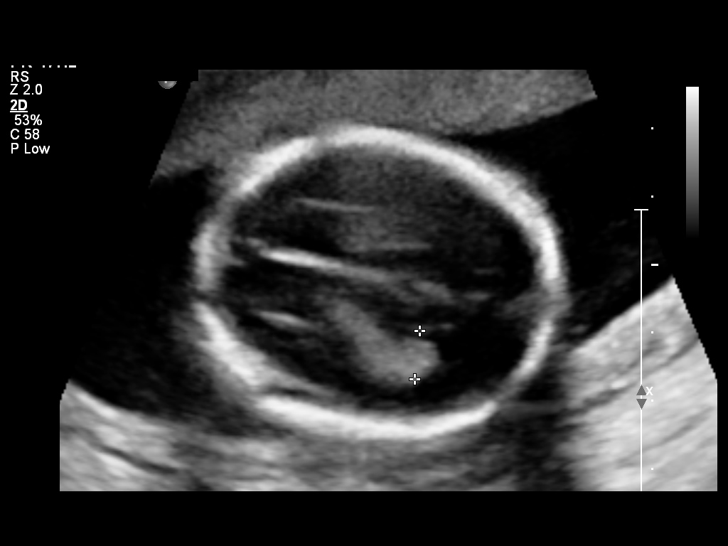
[im 28/75]
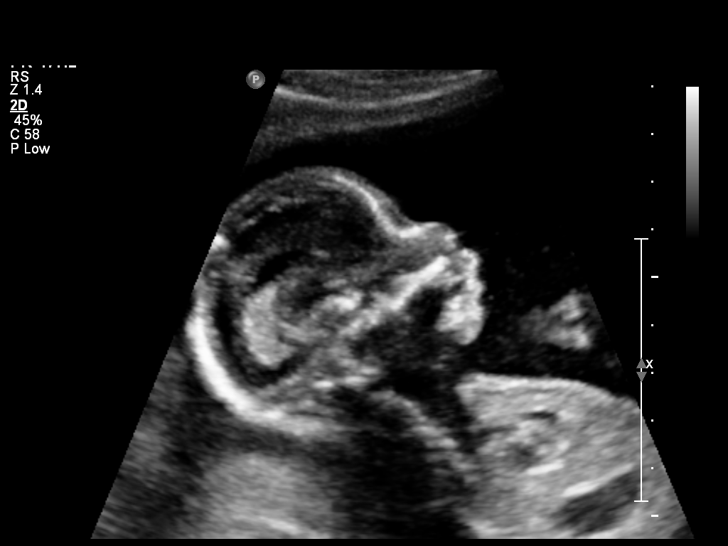
[im 33/75]
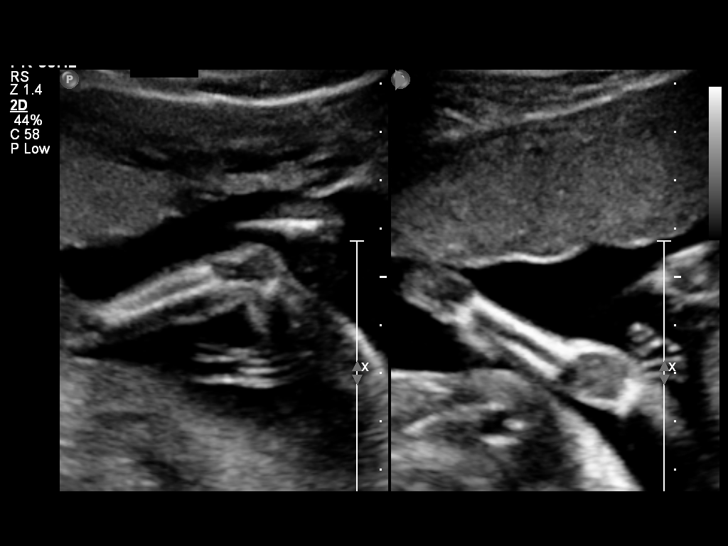
[im 42/75]
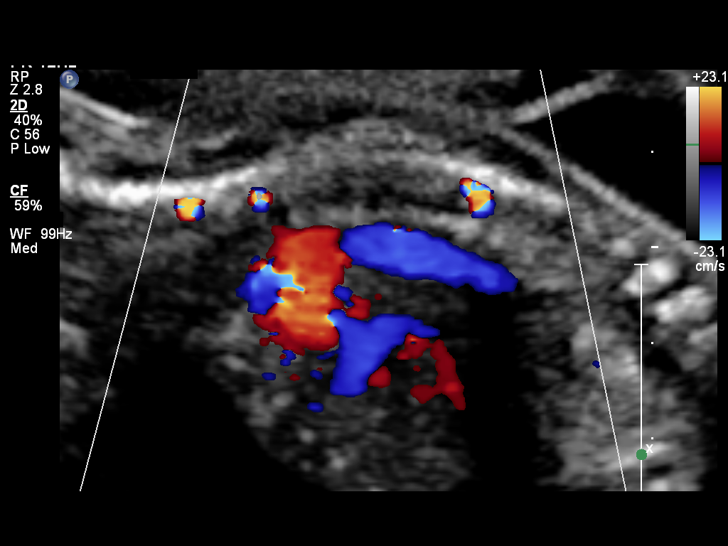
[im 47/75]
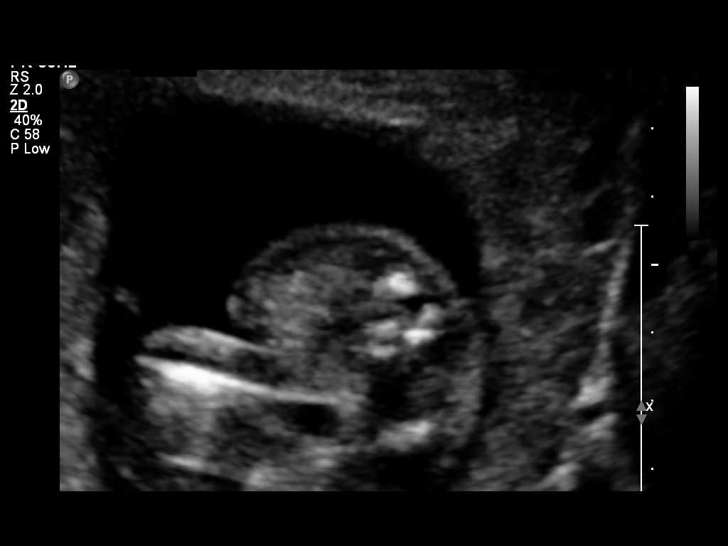
[im 53/75]
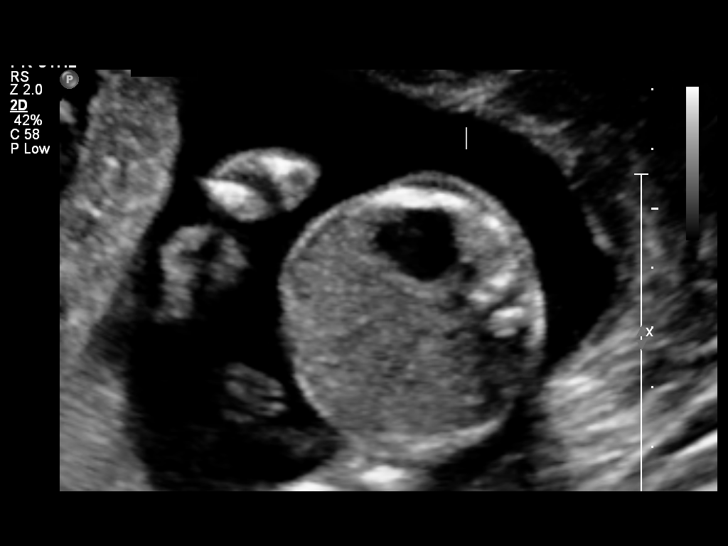
[im 61/75]
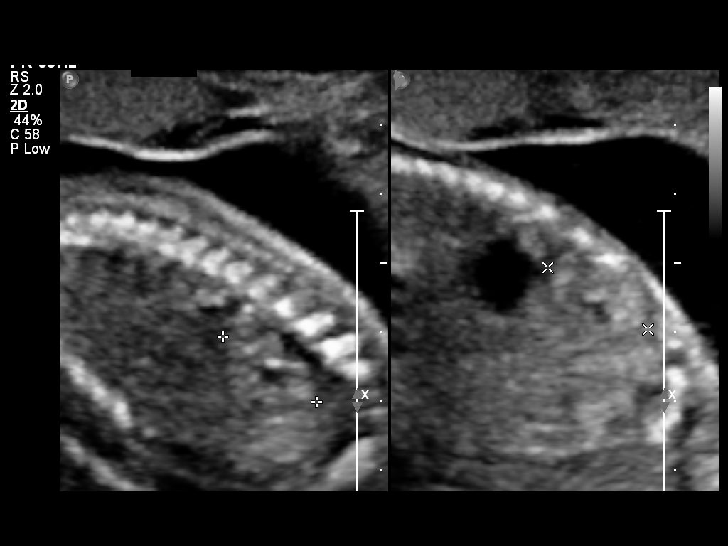
[im 66/75]
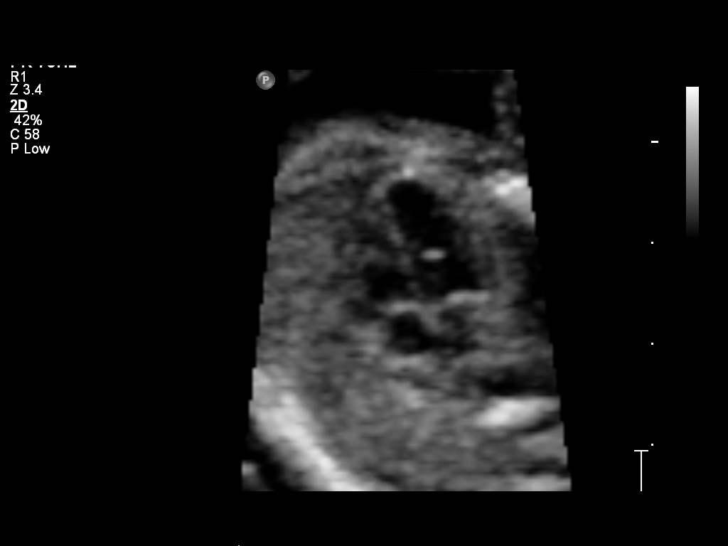
[im 72/75]
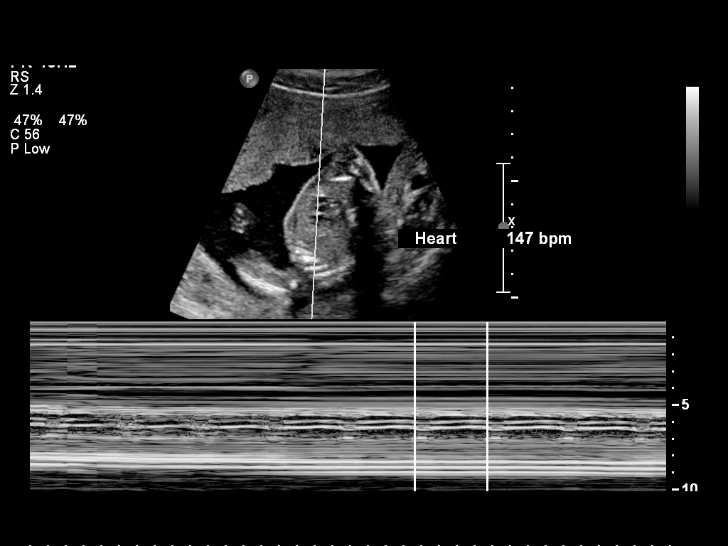

[12 of 28 positions shown; findings below may reference images not displayed]

OBSTETRICS REPORT
                      (Signed Final 04/12/2014 [DATE])

Service(s) Provided

 US OB COMP + 14 WK                                    76805.1
Indications

 Basic anatomic survey
Fetal Evaluation

 Num Of Fetuses:    1
 Fetal Heart Rate:  147                          bpm
 Cardiac Activity:  Observed
 Presentation:      Breech
 Placenta:          Anterior, above cervical os
 P. Cord            Marginal insertion
 Insertion:

 Amniotic Fluid
 AFI FV:      Subjectively within normal limits
                                             Larg Pckt:    5.32  cm
Biometry

 BPD:     43.5  mm     G. Age:  19w 1d                CI:        71.58   70 - 86
                                                      FL/HC:      18.0   16.1 -

 HC:     163.7  mm     G. Age:  19w 1d       55  %    HC/AC:      1.14   1.09 -

 AC:     143.2  mm     G. Age:  19w 4d       72  %    FL/BPD:
 FL:      29.5  mm     G. Age:  19w 1d       52  %    FL/AC:      20.6   20 - 24
 HUM:     29.3  mm     G. Age:  19w 4d       69  %
 CER:     18.2  mm     G. Age:  18w 1d       26  %
 NFT:     4.68  mm

 Est. FW:     288  gm    0 lb 10 oz      53  %
Gestational Age

 LMP:           18w 6d        Date:  12/01/13                 EDD:   09/07/14
 U/S Today:     19w 2d                                        EDD:   09/04/14
 Best:          18w 6d     Det. By:  LMP  (12/01/13)          EDD:   09/07/14
Anatomy

 Cranium:          Appears normal         Aortic Arch:      Appears normal
 Fetal Cavum:      Appears normal         Ductal Arch:      Appears normal
 Ventricles:       Appears normal         Diaphragm:        Appears normal
 Choroid Plexus:   Appears normal         Stomach:          Appears normal, left
                                                            sided
 Cerebellum:       Appears normal         Abdomen:          Appears normal
 Posterior Fossa:  Appears normal         Abdominal Wall:   Appears nml (cord
                                                            insert, abd wall)
 Nuchal Fold:      Appears normal         Cord Vessels:     Appears normal (3
                                                            vessel cord)
 Face:             Appears normal         Kidneys:          Appear normal
                   (orbits and profile)
 Lips:             Appears normal         Bladder:          Appears normal
 Heart:            Appears normal         Spine:            Appears normal
                   (4CH, axis, and
                   situs)
 RVOT:             Appears normal         Lower             Appears normal
                                          Extremities:
 LVOT:             Appears normal         Upper             Appears normal
                                          Extremities:

 Other:  Fetus appears to be a male. Heels and 5th digit visualized.
Targeted Anatomy

 Fetal Central Nervous System
 Cisterna Magna:
Cervix Uterus Adnexa

 Cervical Length:    3.59     cm

 Cervix:       Normal appearance by transabdominal scan.
 Uterus:       No abnormality visualized.
 Cul De Sac:   No free fluid seen.
 Left Ovary:    Within normal limits.
 Right Ovary:   Within normal limits.

 Adnexa:     No abnormality visualized.
Impression

 SIUP at 18+6 weeks
 Normal detailed fetal anatomy
 Markers of aneuploidy: none
 Normal amniotic fluid volume
 Measurements consistent with LMP dating
Recommendations

 Follow-up as clinically indicated
